# Patient Record
Sex: Female | Born: 1975 | Race: White | Hispanic: No | Smoking: Current some day smoker
Health system: Southern US, Community
[De-identification: ages and names within clinical notes are randomized; demographics above are authoritative.]

## PROBLEM LIST (undated history)

## (undated) DIAGNOSIS — N83209 Unspecified ovarian cyst, unspecified side: Secondary | ICD-10-CM

## (undated) DIAGNOSIS — C539 Malignant neoplasm of cervix uteri, unspecified: Secondary | ICD-10-CM

## (undated) DIAGNOSIS — J302 Other seasonal allergic rhinitis: Secondary | ICD-10-CM

## (undated) DIAGNOSIS — C801 Malignant (primary) neoplasm, unspecified: Secondary | ICD-10-CM

## (undated) HISTORY — PX: UPPER GASTROINTESTINAL ENDOSCOPY: SHX188

## (undated) HISTORY — PX: TUBAL LIGATION: SHX77

## (undated) HISTORY — PX: CHOLECYSTECTOMY: SHX55

## (undated) HISTORY — DX: Malignant neoplasm of cervix uteri, unspecified: C53.9

## (undated) HISTORY — PX: COLONOSCOPY: SHX174

---

## 1996-10-16 DIAGNOSIS — C539 Malignant neoplasm of cervix uteri, unspecified: Secondary | ICD-10-CM

## 1996-10-16 HISTORY — DX: Malignant neoplasm of cervix uteri, unspecified: C53.9

## 2008-04-15 ENCOUNTER — Emergency Department (HOSPITAL_BASED_OUTPATIENT_CLINIC_OR_DEPARTMENT_OTHER): Admission: EM | Admit: 2008-04-15 | Discharge: 2008-04-15 | Payer: Self-pay | Admitting: Emergency Medicine

## 2008-05-13 ENCOUNTER — Emergency Department (HOSPITAL_BASED_OUTPATIENT_CLINIC_OR_DEPARTMENT_OTHER): Admission: EM | Admit: 2008-05-13 | Discharge: 2008-05-13 | Payer: Self-pay | Admitting: Emergency Medicine

## 2008-05-28 ENCOUNTER — Emergency Department (HOSPITAL_BASED_OUTPATIENT_CLINIC_OR_DEPARTMENT_OTHER): Admission: EM | Admit: 2008-05-28 | Discharge: 2008-05-28 | Payer: Self-pay | Admitting: Emergency Medicine

## 2008-10-07 ENCOUNTER — Ambulatory Visit: Payer: Self-pay | Admitting: Diagnostic Radiology

## 2008-10-07 ENCOUNTER — Emergency Department (HOSPITAL_BASED_OUTPATIENT_CLINIC_OR_DEPARTMENT_OTHER): Admission: EM | Admit: 2008-10-07 | Discharge: 2008-10-07 | Payer: Self-pay | Admitting: Emergency Medicine

## 2010-01-07 ENCOUNTER — Emergency Department (HOSPITAL_BASED_OUTPATIENT_CLINIC_OR_DEPARTMENT_OTHER): Admission: EM | Admit: 2010-01-07 | Discharge: 2010-01-07 | Payer: Self-pay | Admitting: Emergency Medicine

## 2010-01-07 ENCOUNTER — Ambulatory Visit: Payer: Self-pay | Admitting: Diagnostic Radiology

## 2010-02-09 ENCOUNTER — Emergency Department (HOSPITAL_BASED_OUTPATIENT_CLINIC_OR_DEPARTMENT_OTHER): Admission: EM | Admit: 2010-02-09 | Discharge: 2010-02-09 | Payer: Self-pay | Admitting: Emergency Medicine

## 2010-02-22 ENCOUNTER — Emergency Department (HOSPITAL_BASED_OUTPATIENT_CLINIC_OR_DEPARTMENT_OTHER): Admission: EM | Admit: 2010-02-22 | Discharge: 2010-02-22 | Payer: Self-pay | Admitting: Emergency Medicine

## 2010-02-23 ENCOUNTER — Emergency Department (HOSPITAL_BASED_OUTPATIENT_CLINIC_OR_DEPARTMENT_OTHER): Admission: EM | Admit: 2010-02-23 | Discharge: 2010-02-23 | Payer: Self-pay | Admitting: Emergency Medicine

## 2011-04-03 ENCOUNTER — Emergency Department (HOSPITAL_BASED_OUTPATIENT_CLINIC_OR_DEPARTMENT_OTHER)
Admission: EM | Admit: 2011-04-03 | Discharge: 2011-04-03 | Disposition: A | Discharge status: Home / Self Care | Payer: Self-pay | Attending: Emergency Medicine | Admitting: Emergency Medicine

## 2011-04-03 ENCOUNTER — Emergency Department (INDEPENDENT_AMBULATORY_CARE_PROVIDER_SITE_OTHER): Payer: Self-pay

## 2011-04-03 DIAGNOSIS — Z8739 Personal history of other diseases of the musculoskeletal system and connective tissue: Secondary | ICD-10-CM | POA: Insufficient documentation

## 2011-04-03 DIAGNOSIS — M25579 Pain in unspecified ankle and joints of unspecified foot: Secondary | ICD-10-CM | POA: Insufficient documentation

## 2011-04-03 DIAGNOSIS — F319 Bipolar disorder, unspecified: Secondary | ICD-10-CM | POA: Insufficient documentation

## 2011-04-03 DIAGNOSIS — F17200 Nicotine dependence, unspecified, uncomplicated: Secondary | ICD-10-CM | POA: Insufficient documentation

## 2011-04-03 DIAGNOSIS — M79609 Pain in unspecified limb: Secondary | ICD-10-CM

## 2011-05-21 ENCOUNTER — Emergency Department (INDEPENDENT_AMBULATORY_CARE_PROVIDER_SITE_OTHER): Payer: Self-pay

## 2011-05-21 ENCOUNTER — Emergency Department (HOSPITAL_BASED_OUTPATIENT_CLINIC_OR_DEPARTMENT_OTHER)
Admission: EM | Admit: 2011-05-21 | Discharge: 2011-05-21 | Disposition: A | Discharge status: Home / Self Care | Payer: Self-pay | Attending: Emergency Medicine | Admitting: Emergency Medicine

## 2011-05-21 ENCOUNTER — Encounter: Payer: Self-pay | Admitting: Emergency Medicine

## 2011-05-21 DIAGNOSIS — W19XXXA Unspecified fall, initial encounter: Secondary | ICD-10-CM

## 2011-05-21 DIAGNOSIS — M25579 Pain in unspecified ankle and joints of unspecified foot: Secondary | ICD-10-CM | POA: Insufficient documentation

## 2011-05-21 DIAGNOSIS — M25569 Pain in unspecified knee: Secondary | ICD-10-CM

## 2011-05-21 MED ORDER — TRAMADOL HCL 50 MG PO TABS: 50.0000 mg | ORAL_TABLET | Freq: Four times a day (QID) | ORAL | Status: AC | PRN

## 2011-05-21 NOTE — ED Provider Notes (Signed)
History     CSN: [id number] Arrival date & time: 05/21/2011  6:20 PM  Chief Complaint  Patient presents with  . Knee Pain    Pt report injury to L knee and L ankle while walking with prior injury to area   HPI Comments: Pt states that he has previous injury to the area and then she twisted the area and fell today  Patient is a 35 y.o. female presenting with knee pain. The history is provided by the patient. No language interpreter was used.  Knee Pain This is a recurrent problem. The current episode started today. The problem occurs constantly. The problem has been unchanged. Pertinent negatives include no joint swelling. The symptoms are aggravated by walking and twisting. She has tried nothing for the symptoms. The treatment provided no relief.    No past medical history on file.  Past Surgical History  Procedure Date  . Cholecystectomy     Family History  Problem Relation Age of Onset  . Cancer Mother   . Asthma Father     History  Substance Use Topics  . Smoking status: Current Some Day Smoker -- 1.0 packs/day  . Smokeless tobacco: Not on file  . Alcohol Use: No    OB History    Grav Para Term Preterm Abortions TAB SAB Ect Mult Living                  Review of Systems  Musculoskeletal: Negative for joint swelling.  All other systems reviewed and are negative.    Physical Exam  BP 135/77  Pulse 99  Temp(Src) 97.9 F (36.6 C) (Oral)  Resp 22  SpO2 100%  LMP 05/21/2011  Physical Exam  Nursing note and vitals reviewed. Constitutional: She is oriented to person, place, and time. She appears well-developed and well-nourished.  HENT:  Head: Normocephalic.  Cardiovascular: Normal rate and regular rhythm.   Pulmonary/Chest: Effort normal and breath sounds normal.  Musculoskeletal: Normal range of motion.       No obvious swelling or deformity noted to the left ankle  Neurological: She is alert and oriented to person, place, and time.  Skin: Skin is warm  and dry.  Psychiatric: She has a normal mood and affect.    ED Course  Procedures  No results found for this or any previous visit. Dg Ankle Complete Left  05/21/2011  *RADIOLOGY REPORT*  Clinical Data: Fall, left ankle pain  LEFT ANKLE COMPLETE - 3+ VIEW  Comparison: 10/07/2008  Findings: No fracture or dislocation is seen. The ankle mortise is intact.  The base of the fifth metatarsal appears intact.  The joint spaces are preserved.  The visualized soft tissues are unremarkable.  IMPRESSION: No fracture or dislocation is seen.  Original Report Authenticated By: Charline Bills, M.D.   Dg Knee Complete 4 Views Left  05/21/2011  *RADIOLOGY REPORT*  Clinical Data: Fall, left knee pain  LEFT KNEE - COMPLETE 4+ VIEW  Comparison: 01/07/2010  Findings: No fracture or dislocation is seen.  The joint spaces are preserved.  The visualized soft tissues are unremarkable.  No suprapatellar knee joint effusion.  IMPRESSION: Normal knee radiographs.  Original Report Authenticated By: Charline Bills, M.D.    MDM No acute bony abnormality noted:pt refusing any splinting      Teressa Lower, NP 05/21/11 1942

## 2011-05-21 NOTE — ED Notes (Signed)
Pt report twisting L knee and L ankle while walking today guarding walkin with limp to site

## 2011-05-22 NOTE — ED Provider Notes (Signed)
Medical screening examination/treatment/procedure(s) were performed by non-physician practitioner and as supervising physician I was immediately available for consultation/collaboration.   Juliet Rude. Rubin Payor, MD 05/22/11 9562

## 2012-01-02 ENCOUNTER — Emergency Department (INDEPENDENT_AMBULATORY_CARE_PROVIDER_SITE_OTHER): Payer: Self-pay

## 2012-01-02 ENCOUNTER — Encounter (HOSPITAL_BASED_OUTPATIENT_CLINIC_OR_DEPARTMENT_OTHER): Payer: Self-pay | Admitting: Emergency Medicine

## 2012-01-02 ENCOUNTER — Emergency Department (HOSPITAL_BASED_OUTPATIENT_CLINIC_OR_DEPARTMENT_OTHER)
Admission: EM | Admit: 2012-01-02 | Discharge: 2012-01-02 | Disposition: A | Discharge status: Home / Self Care | Payer: Self-pay | Attending: Emergency Medicine | Admitting: Emergency Medicine

## 2012-01-02 DIAGNOSIS — J40 Bronchitis, not specified as acute or chronic: Secondary | ICD-10-CM | POA: Insufficient documentation

## 2012-01-02 DIAGNOSIS — F17200 Nicotine dependence, unspecified, uncomplicated: Secondary | ICD-10-CM | POA: Insufficient documentation

## 2012-01-02 DIAGNOSIS — Z79899 Other long term (current) drug therapy: Secondary | ICD-10-CM | POA: Insufficient documentation

## 2012-01-02 DIAGNOSIS — R05 Cough: Secondary | ICD-10-CM

## 2012-01-02 DIAGNOSIS — J45909 Unspecified asthma, uncomplicated: Secondary | ICD-10-CM | POA: Insufficient documentation

## 2012-01-02 HISTORY — DX: Other seasonal allergic rhinitis: J30.2

## 2012-01-02 MED ORDER — FLUCONAZOLE 150 MG PO TABS: 150.0000 mg | ORAL_TABLET | Freq: Every day | ORAL | Status: AC

## 2012-01-02 MED ORDER — ALBUTEROL SULFATE 2 MG PO TABS: 2.0000 mg | ORAL_TABLET | Freq: Three times a day (TID) | ORAL | Status: DC

## 2012-01-02 MED ORDER — AZITHROMYCIN 250 MG PO TABS: 250.0000 mg | ORAL_TABLET | Freq: Every day | ORAL | Status: AC

## 2012-01-02 MED ORDER — ALBUTEROL SULFATE HFA 108 (90 BASE) MCG/ACT IN AERS
2.0000 | INHALATION_SPRAY | Freq: Once | RESPIRATORY_TRACT | Status: AC
  Administered 2012-01-02: 2 via RESPIRATORY_TRACT
  Filled 2012-01-02: qty 6.7

## 2012-01-02 MED ORDER — PREDNISONE 20 MG PO TABS: 60.0000 mg | ORAL_TABLET | Freq: Every day | ORAL | Status: DC

## 2012-01-02 NOTE — Discharge Instructions (Signed)
Bronchitis Bronchitis is a problem of the air tubes leading to your lungs. This problem makes it hard for air to get in and out of the lungs. You may cough a lot because your air tubes are narrow. Going without care can cause lasting (chronic) bronchitis. HOME CARE   Drink enough fluids to keep your pee (urine) clear or pale yellow.   Use a cool mist humidifier.   Quit smoking if you smoke. If you keep smoking, the bronchitis might not get better.   Only take medicine as told by your doctor.  GET HELP RIGHT AWAY IF:   Coughing keeps you awake.   You start to wheeze.   You become more sick or weak.   You have a hard time breathing or get short of breath.   You cough up blood.   Coughing lasts more than 2 weeks.   You have a fever.   Your baby is older than 3 months with a rectal temperature of 102 F (38.9 C) or higher.   Your baby is 3 months old or younger with a rectal temperature of 100.4 F (38 C) or higher.  MAKE SURE YOU:  Understand these instructions.   Will watch your condition.   Will get help right away if you are not doing well or get worse.  Document Released: 03/20/2008 Document Revised: 09/21/2011 Document Reviewed: 09/03/2009 ExitCare Patient Information 2012 [employer] RESOURCE GUIDE  Dental Problems  Patients with Medicaid: [facility]                     [facility] [address]                                           [address] Phone:  [phone]                                                   Phone:  [phone]  If unable to pay or uninsured, contact:  [facility] or [facility] to become qualified for the adult dental clinic.  Chronic Pain Problems Contact [facility]  [phone] Patients need to be referred by their primary care doctor.  Insufficient Money for Medicine Contact [employer]:  call "211" or [facility] [phone].  No Primary Care  Doctor Call [facility]  [phone] Other agencies that provide inexpensive medical care    [facility]  [phone]    [facility]  [phone]    [facility]  [phone]    [facility]  [phone]    [facility]  [phone]    [facility]  [phone]  Psychological Services [facility]  [phone] [facility]  [phone] [facility]   [phone] (emergency services [phone])  Abuse/Neglect [place] Child Abuse Hotline (336) 641-3795 [place] [facility] Abuse Hotline 800-378-5315 (After Hours)  Emergency Shelter [facility] (336) 271-5985  [facility] (336) 275-9566 [facility] (704) 372-4663  MRSA Hotline #:   832-7006    [place] [facility]  [facility] [place]  [employer]                             [facility] [address] [place]                     [address]         [address]  [place]                                               [place]                              [place] Phone:  [phone]                                  Phone:  [phone]                   Phone:  [phone]  [facility] Phone:  [phone]  [place] Child Abuse Hotline (336) 342-1394 (336) 342-3537 (After Hours)  

## 2012-01-02 NOTE — ED Notes (Signed)
Pt c/o np cough, pain in ears, dry throat since November

## 2012-01-02 NOTE — ED Provider Notes (Signed)
History     CSN: [id number]  Arrival date & time 01/02/12  1540   First MD Initiated Contact with Patient 01/02/12 1618      Chief Complaint  Patient presents with  . Cough    (Consider location/radiation/quality/duration/timing/severity/associated sxs/prior treatment) HPI  pw h/o chronic bronchitis, asthma, chronic cough, worsening x days. +wheezing, worse at night. Productive cough with yellow phlegm, worse in the morning. Zyrtec, benadryl, allegra, mucinex with min relief. +Shortness of breath chronic November worsening.  C/O chest tightness under both breasts x one month. +Subj fever +chills. No rash.  No sick contacts. Recently lost her insurance, therefore ran out of albuterol inhaler  She is a smoker 3-5 cig/day   Past Medical History  Diagnosis Date  . Asthma   . Seasonal allergies   . Endometriosis     Past Surgical History  Procedure Date  . Cholecystectomy   . Tubal ligation   . Upper gastrointestinal endoscopy   . Colonoscopy     Family History  Problem Relation Age of Onset  . Cancer Mother   . Asthma Father     History  Substance Use Topics  . Smoking status: Current Some Day Smoker -- 1.0 packs/day  . Smokeless tobacco: Not on file  . Alcohol Use: No    OB History    Grav Para Term Preterm Abortions TAB SAB Ect Mult Living                  Review of Systems  All other systems reviewed and are negative.  except as noted HPI   Allergies  Chocolate; Onion; Iodine; Monistat; and Morphine and related  Home Medications   Current Outpatient Rx  Name Route Sig Dispense Refill  . ACETAMINOPHEN 500 MG PO TABS Oral Take 1,000 mg by mouth every 6 (six) hours as needed. For pain    . CETIRIZINE HCL 10 MG PO TABS Oral Take 10 mg by mouth daily.    Marland Kitchen DIPHENHYDRAMINE HCL 25 MG PO TABS Oral Take 50 mg by mouth 3 (three) times daily as needed. For runny nose    . FEXOFENADINE HCL 180 MG PO TABS Oral Take 180 mg by mouth daily.    . GUAIFENESIN ER  600 MG PO TB12 Oral Take 1,200 mg by mouth 3 (three) times daily.    . IBUPROFEN 800 MG PO TABS Oral Take 1,600 mg by mouth 2 (two) times daily as needed. For pain     . ALBUTEROL SULFATE 2 MG PO TABS Oral Take 1 tablet (2 mg total) by mouth 3 (three) times daily. 30 tablet 1  . AZITHROMYCIN 250 MG PO TABS Oral Take 1 tablet (250 mg total) by mouth daily. Take first 2 tablets together, then 1 every day until finished. 6 tablet 0  . PREDNISONE 20 MG PO TABS Oral Take 3 tablets (60 mg total) by mouth daily. 15 tablet 0    BP 128/80  Pulse 104  Temp(Src) 98.1 F (36.7 C) (Oral)  Resp 20  Ht 5\' 7"  (1.702 m)  Wt 216 lb (97.977 kg)  BMI 33.83 kg/m2  SpO2 100%  LMP 12/29/2011  Physical Exam  Nursing note and vitals reviewed. Constitutional: She is oriented to person, place, and time. She appears well-developed.  HENT:  Head: Atraumatic.       Cobblestoning posterior OP  Eyes: Conjunctivae and EOM are normal. Pupils are equal, round, and reactive to light.  Neck: Normal range of motion. Neck supple.  Cardiovascular:  Normal rate, regular rhythm, normal heart sounds and intact distal pulses.   Pulmonary/Chest: Effort normal. No respiratory distress. She has wheezes. She has no rales.       Min exp wheeze > Lt lung fields  Abdominal: Soft. She exhibits no distension. There is no tenderness. There is no rebound and no guarding.  Musculoskeletal: Normal range of motion.  Neurological: She is alert and oriented to person, place, and time.  Skin: Skin is warm and dry. No rash noted.  Psychiatric: She has a normal mood and affect.    ED Course  Procedures (including critical care time)  Labs Reviewed - No data to display Dg Chest 2 View  01/02/2012  *RADIOLOGY REPORT*  Clinical Data: History of cough since November 2012.  CHEST - 2 VIEW  Comparison: No priors.  Findings: Lungs appear mildly hyperexpanded.  No consolidative airspace disease.  No pleural effusions.  Pulmonary vasculature is  normal.  Cardiomediastinal silhouette is within normal limits.  IMPRESSION: 1.  Mild hyperexpansion without other acute findings.  This is nonspecific, and could be related to an exaggerated inspiratory effort during the examination, or could suggest underlying reactive airway disease.  Clinical correlation is recommended.  Original Report Authenticated By: Florencia Reasons, M.D.     1. Bronchitis     MDM  Acute on chronic bronchitis. Albuterol inh in ED. Home with azithromycin, prednisone, albuterol PO tabs. PMD referral.       Forbes Cellar, MD 01/02/12 [phone]

## 2012-05-12 ENCOUNTER — Encounter (HOSPITAL_BASED_OUTPATIENT_CLINIC_OR_DEPARTMENT_OTHER): Payer: Self-pay | Admitting: *Deleted

## 2012-05-12 ENCOUNTER — Emergency Department (HOSPITAL_BASED_OUTPATIENT_CLINIC_OR_DEPARTMENT_OTHER)
Admission: EM | Admit: 2012-05-12 | Discharge: 2012-05-12 | Disposition: A | Discharge status: Home / Self Care | Payer: Self-pay | Attending: Emergency Medicine | Admitting: Emergency Medicine

## 2012-05-12 DIAGNOSIS — X500XXA Overexertion from strenuous movement or load, initial encounter: Secondary | ICD-10-CM | POA: Insufficient documentation

## 2012-05-12 DIAGNOSIS — F17200 Nicotine dependence, unspecified, uncomplicated: Secondary | ICD-10-CM | POA: Insufficient documentation

## 2012-05-12 DIAGNOSIS — Y9389 Activity, other specified: Secondary | ICD-10-CM | POA: Insufficient documentation

## 2012-05-12 DIAGNOSIS — S8390XA Sprain of unspecified site of unspecified knee, initial encounter: Secondary | ICD-10-CM

## 2012-05-12 DIAGNOSIS — IMO0002 Reserved for concepts with insufficient information to code with codable children: Secondary | ICD-10-CM | POA: Insufficient documentation

## 2012-05-12 DIAGNOSIS — Y9301 Activity, walking, marching and hiking: Secondary | ICD-10-CM | POA: Insufficient documentation

## 2012-05-12 DIAGNOSIS — Y998 Other external cause status: Secondary | ICD-10-CM | POA: Insufficient documentation

## 2012-05-12 DIAGNOSIS — J45909 Unspecified asthma, uncomplicated: Secondary | ICD-10-CM | POA: Insufficient documentation

## 2012-05-12 DIAGNOSIS — T148XXA Other injury of unspecified body region, initial encounter: Secondary | ICD-10-CM

## 2012-05-12 DIAGNOSIS — M549 Dorsalgia, unspecified: Secondary | ICD-10-CM | POA: Insufficient documentation

## 2012-05-12 MED ORDER — DIAZEPAM 5 MG PO TABS: ORAL_TABLET | ORAL | Status: AC

## 2012-05-12 MED ORDER — IBUPROFEN 800 MG PO TABS: 800.0000 mg | ORAL_TABLET | Freq: Three times a day (TID) | ORAL | Status: AC

## 2012-05-12 MED ORDER — HYDROCODONE-ACETAMINOPHEN 5-500 MG PO TABS: 1.0000 | ORAL_TABLET | Freq: Four times a day (QID) | ORAL | Status: AC | PRN

## 2012-05-12 MED ORDER — IBUPROFEN 800 MG PO TABS
800.0000 mg | ORAL_TABLET | Freq: Once | ORAL | Status: DC
  Filled 2012-05-12: qty 1

## 2012-05-12 MED ORDER — OXYCODONE-ACETAMINOPHEN 5-325 MG PO TABS
2.0000 | ORAL_TABLET | Freq: Once | ORAL | Status: DC
  Filled 2012-05-12: qty 2

## 2012-05-12 NOTE — ED Notes (Signed)
Pt reports she was moving a fish tank down steps and started to fall- now c/o left knee and back pain

## 2012-05-12 NOTE — ED Provider Notes (Signed)
History     CSN: [id number]  Arrival date & time 05/12/12  1954   First MD Initiated Contact with Patient 05/12/12 2005      Chief Complaint  Patient presents with  . Back Pain  . Knee Pain    (Consider location/radiation/quality/duration/timing/severity/associated sxs/prior treatment) Patient is a 36 y.o. female presenting with back pain and knee pain. The history is provided by the patient.  Back Pain   Knee Pain   patient presents to emergency department complaining of left knee injury and back injury about two hours prior to arrival. Patient states that she was carrying a large fish take down the steps and began to fall and twisted her knee and back to keep herself from falling. Patient states she has history of ligament injury of her left knee with intermittent pain over many years. Patient states that pain is aggravated by movement and improved with lying still. Patient took nothing for pain prior to arrival. She denies any extremity numbness/tingling/weakness. She denies additional injury. Patient states she did not actually fall to the ground but caught herself. She denies swelling of joints or break in skin.  Past Medical History  Diagnosis Date  . Asthma   . Seasonal allergies   . Endometriosis     Past Surgical History  Procedure Date  . Cholecystectomy   . Tubal ligation   . Upper gastrointestinal endoscopy   . Colonoscopy     Family History  Problem Relation Age of Onset  . Cancer Mother   . Asthma Father     History  Substance Use Topics  . Smoking status: Current Some Day Smoker -- 1.0 packs/day  . Smokeless tobacco: Never Used  . Alcohol Use: No    OB History    Grav Para Term Preterm Abortions TAB SAB Ect Mult Living                  Review of Systems  Musculoskeletal: Positive for back pain.  All other systems reviewed and are negative.    Allergies  Chocolate; Onion; Iodine; Miconazole nitrate; and Morphine and related  Home  Medications   Current Outpatient Rx  Name Route Sig Dispense Refill  . ALBUTEROL SULFATE HFA 108 (90 BASE) MCG/ACT IN AERS Inhalation Inhale 2 puffs into the lungs every 6 (six) hours as needed. For wheezing or shortness of breath    . DIPHENHYDRAMINE HCL 25 MG PO TABS Oral Take 50 mg by mouth 2 (two) times daily as needed. For allergies    . FEXOFENADINE HCL 180 MG PO TABS Oral Take 180 mg by mouth daily.    Marland Kitchen FLUTICASONE-SALMETEROL 250-50 MCG/DOSE IN AEPB Inhalation Inhale 1 puff into the lungs 2 (two) times daily as needed. For severe asthma attacks    . DIAZEPAM 5 MG PO TABS  Take 1 tablet by mouth every 4-6 hours as needed for muscle relaxation 15 tablet 0  . HYDROCODONE-ACETAMINOPHEN 5-500 MG PO TABS Oral Take 1-2 tablets by mouth every 6 (six) hours as needed for pain. 15 tablet 0  . IBUPROFEN 800 MG PO TABS Oral Take 1 tablet (800 mg total) by mouth 3 (three) times daily. Take 800mg  by mouth at breakfast, lunch and dinner for the next 5 days 21 tablet 0    BP 127/82  Pulse 93  Temp 97.9 F (36.6 C) (Oral)  Resp 20  Ht 5\' 7"  (1.702 m)  Wt 225 lb (102.059 kg)  BMI 35.24 kg/m2  SpO2 99%  LMP  05/05/2012  Physical Exam  Constitutional: She is oriented to person, place, and time. She appears well-developed and well-nourished. No distress.  HENT:  Head: Normocephalic and atraumatic.  Eyes: Conjunctivae are normal.  Neck: Normal range of motion. Neck supple.  Cardiovascular: Normal rate, regular rhythm and intact distal pulses.   Pulmonary/Chest: Effort normal.  Musculoskeletal: Normal range of motion. She exhibits tenderness. She exhibits no edema.       Right ankle: She exhibits swelling. tenderness.       Tenderness to palpation of soft tissue of lower lumbar back but no midline spinal tenderness to palpation. Tenderness to palpation of entire left knee with pain with full range of motion but no crepitus. Negative ballottement. No laxity with anterior/posterior/medial/lateral  stress. No swelling of lower extremity. 5 out of 5 strength of bilateral lower extremity is. Normal reflexes.  Neurological: She is alert and oriented to person, place, and time. She has normal reflexes.       Normal sensation of entire foot.   Skin: Skin is warm and dry. No rash noted. She is not diaphoretic. No erythema. No pallor.  Psychiatric: She has a normal mood and affect. Her behavior is normal.    ED Course  Procedures (including critical care time)  PO ibuprofen and percocet.   Labs Reviewed - No data to display No results found.   1. Back pain   2. Muscle strain   3. Knee sprain       MDM  Strain of lower back and sprain of left knee without any red flags for back pain. No signs or symptoms of central cord compression or cauda equina. Patient did not actually fall to the ground but twisted her back and knee and caught herself from falling. Question soft tissue injury of knee. She has no effusion and knee or laxity with stress and LLE is neurvo vasc intact. She is ambulating with mild pain in knee and back but without difficulty. Spoke at length with patient conservative management of muscle strain and knee sprain but gave her primary care referral for further evaluation of ongoing pain. Patient voices her understanding is agreeable plan. Patient is not due to return to work for one week and therefore can rest at home.        [place], [place] 05/12/12 2039

## 2012-05-14 NOTE — ED Provider Notes (Signed)
Medical screening examination/treatment/procedure(s) were performed by non-physician practitioner and as supervising physician I was immediately available for consultation/collaboration.  Roseland Braun, MD 05/14/12 1038 

## 2012-10-16 ENCOUNTER — Emergency Department (HOSPITAL_BASED_OUTPATIENT_CLINIC_OR_DEPARTMENT_OTHER)
Admission: EM | Admit: 2012-10-16 | Discharge: 2012-10-16 | Disposition: A | Discharge status: Home / Self Care | Payer: Self-pay | Attending: Emergency Medicine | Admitting: Emergency Medicine

## 2012-10-16 ENCOUNTER — Encounter (HOSPITAL_BASED_OUTPATIENT_CLINIC_OR_DEPARTMENT_OTHER): Payer: Self-pay | Admitting: *Deleted

## 2012-10-16 DIAGNOSIS — Y939 Activity, unspecified: Secondary | ICD-10-CM | POA: Insufficient documentation

## 2012-10-16 DIAGNOSIS — J309 Allergic rhinitis, unspecified: Secondary | ICD-10-CM | POA: Insufficient documentation

## 2012-10-16 DIAGNOSIS — Z79899 Other long term (current) drug therapy: Secondary | ICD-10-CM | POA: Insufficient documentation

## 2012-10-16 DIAGNOSIS — L0291 Cutaneous abscess, unspecified: Secondary | ICD-10-CM

## 2012-10-16 DIAGNOSIS — Z9089 Acquired absence of other organs: Secondary | ICD-10-CM | POA: Insufficient documentation

## 2012-10-16 DIAGNOSIS — X500XXA Overexertion from strenuous movement or load, initial encounter: Secondary | ICD-10-CM | POA: Insufficient documentation

## 2012-10-16 DIAGNOSIS — F17200 Nicotine dependence, unspecified, uncomplicated: Secondary | ICD-10-CM | POA: Insufficient documentation

## 2012-10-16 DIAGNOSIS — Z9851 Tubal ligation status: Secondary | ICD-10-CM | POA: Insufficient documentation

## 2012-10-16 DIAGNOSIS — S99919A Unspecified injury of unspecified ankle, initial encounter: Secondary | ICD-10-CM | POA: Insufficient documentation

## 2012-10-16 DIAGNOSIS — S86912A Strain of unspecified muscle(s) and tendon(s) at lower leg level, left leg, initial encounter: Secondary | ICD-10-CM

## 2012-10-16 DIAGNOSIS — S8990XA Unspecified injury of unspecified lower leg, initial encounter: Secondary | ICD-10-CM | POA: Insufficient documentation

## 2012-10-16 DIAGNOSIS — Z8742 Personal history of other diseases of the female genital tract: Secondary | ICD-10-CM | POA: Insufficient documentation

## 2012-10-16 DIAGNOSIS — Y92009 Unspecified place in unspecified non-institutional (private) residence as the place of occurrence of the external cause: Secondary | ICD-10-CM | POA: Insufficient documentation

## 2012-10-16 DIAGNOSIS — L02219 Cutaneous abscess of trunk, unspecified: Secondary | ICD-10-CM | POA: Insufficient documentation

## 2012-10-16 DIAGNOSIS — J45909 Unspecified asthma, uncomplicated: Secondary | ICD-10-CM | POA: Insufficient documentation

## 2012-10-16 LAB — BASIC METABOLIC PANEL
BUN: 5 mg/dL — ABNORMAL LOW (ref 6–23)
CO2: 28 mEq/L (ref 19–32)
Calcium: 8.8 mg/dL (ref 8.4–10.5)
Chloride: 101 mEq/L (ref 96–112)
Creatinine, Ser: 0.7 mg/dL (ref 0.50–1.10)
Glucose, Bld: 90 mg/dL (ref 70–99)

## 2012-10-16 LAB — CBC WITH DIFFERENTIAL/PLATELET
Eosinophils Relative: 1 % (ref 0–5)
HCT: 38.2 % (ref 36.0–46.0)
Hemoglobin: 13 g/dL (ref 12.0–15.0)
Lymphocytes Relative: 10 % — ABNORMAL LOW (ref 12–46)
Lymphs Abs: 1.9 10*3/uL (ref 0.7–4.0)
MCV: 93.6 fL (ref 78.0–100.0)
Monocytes Absolute: 1.9 10*3/uL — ABNORMAL HIGH (ref 0.1–1.0)
Monocytes Relative: 10 % (ref 3–12)
Neutro Abs: 15.2 10*3/uL — ABNORMAL HIGH (ref 1.7–7.7)
RDW: 13.1 % (ref 11.5–15.5)
WBC: 19.2 10*3/uL — ABNORMAL HIGH (ref 4.0–10.5)

## 2012-10-16 LAB — URINALYSIS, ROUTINE W REFLEX MICROSCOPIC
Bilirubin Urine: NEGATIVE
Glucose, UA: NEGATIVE mg/dL
Ketones, ur: NEGATIVE mg/dL
Nitrite: POSITIVE — AB
Protein, ur: NEGATIVE mg/dL
Specific Gravity, Urine: 1.009 (ref 1.005–1.030)
Urobilinogen, UA: 0.2 mg/dL (ref 0.0–1.0)
pH: 6 (ref 5.0–8.0)

## 2012-10-16 LAB — PREGNANCY, URINE: Preg Test, Ur: NEGATIVE

## 2012-10-16 LAB — URINE MICROSCOPIC-ADD ON

## 2012-10-16 MED ORDER — VANCOMYCIN HCL IN DEXTROSE 1-5 GM/200ML-% IV SOLN
1000.0000 mg | Freq: Once | INTRAVENOUS | Status: AC
  Administered 2012-10-16: 1000 mg via INTRAVENOUS
  Filled 2012-10-16: qty 200

## 2012-10-16 MED ORDER — HYDROCODONE-ACETAMINOPHEN 5-325 MG PO TABS: 2.0000 | ORAL_TABLET | ORAL | Status: DC | PRN

## 2012-10-16 MED ORDER — SODIUM CHLORIDE 0.9 % IV SOLN
Freq: Once | INTRAVENOUS | Status: AC
  Administered 2012-10-16: 17:00:00 via INTRAVENOUS

## 2012-10-16 MED ORDER — SULFAMETHOXAZOLE-TRIMETHOPRIM 800-160 MG PO TABS: 1.0000 | ORAL_TABLET | Freq: Two times a day (BID) | ORAL | Status: DC

## 2012-10-16 MED ORDER — DEXTROSE 5 % IV SOLN
1.0000 g | INTRAVENOUS | Status: DC
  Administered 2012-10-16: 1 g via INTRAVENOUS
  Filled 2012-10-16: qty 10

## 2012-10-16 MED ORDER — FLUCONAZOLE 150 MG PO TABS: 150.0000 mg | ORAL_TABLET | Freq: Once | ORAL | Status: AC

## 2012-10-16 MED ORDER — FLUCONAZOLE 50 MG PO TABS
150.0000 mg | ORAL_TABLET | Freq: Once | ORAL | Status: AC
  Administered 2012-10-16: 150 mg via ORAL
  Filled 2012-10-16: qty 1

## 2012-10-16 NOTE — ED Notes (Signed)
Pt has multiple complaints, the main one being a ?boil in her groin area. "In protecting that, I have ? Twisted my left knee."

## 2012-10-16 NOTE — ED Provider Notes (Signed)
Medical screening examination/treatment/procedure(s) were performed by non-physician practitioner and as supervising physician I was immediately available for consultation/collaboration.   Gavin Pound. Oletta Lamas, MD 10/16/12 5621

## 2012-10-16 NOTE — ED Provider Notes (Signed)
History     CSN: [id number]  Arrival date & time 10/16/12  1416   First MD Initiated Contact with Patient 10/16/12 1631      Chief Complaint  Patient presents with  . Recurrent Skin Infections    (Consider location/radiation/quality/duration/timing/severity/associated sxs/prior treatment) Patient is a 37 y.o. female presenting with abscess. The history is provided by the patient. No language interpreter was used.  Abscess  This is a new problem. The onset was gradual. The problem occurs continuously. The problem has been gradually worsening. The abscess is present on the left lower leg. The abscess is characterized by redness and swelling. The abscess first occurred at home.   Pt reports she has had 3 abscesses lower abdomen.  Pt reports she was able to bust 2 of them and drain.   Pt complains of feeling sick now and one abscess is increasing in size.   Pt also has knee problems and injured her knee trying not to hit her abdomen Past Medical History  Diagnosis Date  . Asthma   . Seasonal allergies   . Endometriosis     Past Surgical History  Procedure Date  . Cholecystectomy   . Tubal ligation   . Upper gastrointestinal endoscopy   . Colonoscopy     Family History  Problem Relation Age of Onset  . Cancer Mother   . Asthma Father     History  Substance Use Topics  . Smoking status: Current Some Day Smoker -- 1.0 packs/day  . Smokeless tobacco: Never Used  . Alcohol Use: No    OB History    Grav Para Term Preterm Abortions TAB SAB Ect Mult Living                  Review of Systems  Skin: Positive for wound.  All other systems reviewed and are negative.    Allergies  Chocolate; Onion; Iodine; Miconazole nitrate; and Morphine and related  Home Medications   Current Outpatient Rx  Name  Route  Sig  Dispense  Refill  . ALBUTEROL SULFATE HFA 108 (90 BASE) MCG/ACT IN AERS   Inhalation   Inhale 2 puffs into the lungs every 6 (six) hours as needed. For  wheezing or shortness of breath         . DIPHENHYDRAMINE HCL 25 MG PO TABS   Oral   Take 50 mg by mouth 2 (two) times daily as needed. For allergies         . FEXOFENADINE HCL 180 MG PO TABS   Oral   Take 180 mg by mouth daily.         Marland Kitchen FLUTICASONE-SALMETEROL 250-50 MCG/DOSE IN AEPB   Inhalation   Inhale 1 puff into the lungs 2 (two) times daily as needed. For severe asthma attacks           BP 131/74  Pulse 93  Temp 98.2 F (36.8 C) (Oral)  Resp 18  Ht 5\' 7"  (1.702 m)  Wt 200 lb (90.719 kg)  BMI 31.32 kg/m2  SpO2 99%  LMP 10/02/2012  Physical Exam  Nursing note and vitals reviewed. Constitutional: She appears well-developed and well-nourished.  HENT:  Head: Normocephalic.  Eyes: Pupils are equal, round, and reactive to light.  Neck: Normal range of motion.  Cardiovascular: Normal rate and normal heart sounds.   Pulmonary/Chest: Effort normal and breath sounds normal.  Abdominal: Soft. Bowel sounds are normal.  Musculoskeletal: She exhibits tenderness.       Tender left  knee  Skin: Skin is warm.       Swollen red area left pubic area, fluctuant area palpable  Psychiatric: She has a normal mood and affect.    ED Course  INCISION AND DRAINAGE Date/Time: 10/16/2012 7:20 PM Performed by: Elson Areas Authorized by: Elson Areas Consent: Verbal consent not obtained. Risks and benefits: risks, benefits and alternatives were discussed Consent given by: patient Patient identity confirmed: verbally with patient Time out: Immediately prior to procedure a "time out" was called to verify the correct patient, procedure, equipment, support staff and site/side marked as required. Type: abscess Location: suprapubic. Anesthesia: local infiltration Patient sedated: no Scalpel size: 11 Complexity: simple Drainage: purulent Drainage amount: moderate Wound treatment: wound left open Packing material: 1/4 in iodoform gauze Patient tolerance: Patient tolerated  the procedure well with no immediate complications.   (including critical care time)  Labs Reviewed  URINALYSIS, ROUTINE W REFLEX MICROSCOPIC - Abnormal; Notable for the following:    Hgb urine dipstick LARGE (*)     Nitrite POSITIVE (*)     Leukocytes, UA TRACE (*)     All other components within normal limits  URINE MICROSCOPIC-ADD ON - Abnormal; Notable for the following:    Bacteria, UA MANY (*)     All other components within normal limits  CBC WITH DIFFERENTIAL - Abnormal; Notable for the following:    WBC 19.2 (*)     Neutrophils Relative 79 (*)     Neutro Abs 15.2 (*)     Lymphocytes Relative 10 (*)     Monocytes Absolute 1.9 (*)     All other components within normal limits  BASIC METABOLIC PANEL - Abnormal; Notable for the following:    Potassium 3.0 (*)     BUN 5 (*)     All other components within normal limits  PREGNANCY, URINE  URINE CULTURE   No results found.   No diagnosis found.    MDM  Pt given vancomycin and rocephin   Pt given rx for bactrim and hydrocodone for pain        Lonia Skinner [place], [place] 10/16/12 Ernestina [place]

## 2012-10-17 ENCOUNTER — Encounter (HOSPITAL_BASED_OUTPATIENT_CLINIC_OR_DEPARTMENT_OTHER): Payer: Self-pay | Admitting: *Deleted

## 2012-10-17 ENCOUNTER — Emergency Department (HOSPITAL_BASED_OUTPATIENT_CLINIC_OR_DEPARTMENT_OTHER)
Admission: EM | Admit: 2012-10-17 | Discharge: 2012-10-17 | Disposition: A | Discharge status: Home / Self Care | Payer: Self-pay | Attending: Emergency Medicine | Admitting: Emergency Medicine

## 2012-10-17 DIAGNOSIS — J309 Allergic rhinitis, unspecified: Secondary | ICD-10-CM | POA: Insufficient documentation

## 2012-10-17 DIAGNOSIS — L0291 Cutaneous abscess, unspecified: Secondary | ICD-10-CM

## 2012-10-17 DIAGNOSIS — L02219 Cutaneous abscess of trunk, unspecified: Secondary | ICD-10-CM | POA: Insufficient documentation

## 2012-10-17 DIAGNOSIS — F17200 Nicotine dependence, unspecified, uncomplicated: Secondary | ICD-10-CM | POA: Insufficient documentation

## 2012-10-17 DIAGNOSIS — L03319 Cellulitis of trunk, unspecified: Secondary | ICD-10-CM | POA: Insufficient documentation

## 2012-10-17 DIAGNOSIS — Z9851 Tubal ligation status: Secondary | ICD-10-CM | POA: Insufficient documentation

## 2012-10-17 DIAGNOSIS — J45909 Unspecified asthma, uncomplicated: Secondary | ICD-10-CM | POA: Insufficient documentation

## 2012-10-17 DIAGNOSIS — Z8742 Personal history of other diseases of the female genital tract: Secondary | ICD-10-CM | POA: Insufficient documentation

## 2012-10-17 DIAGNOSIS — Z9089 Acquired absence of other organs: Secondary | ICD-10-CM | POA: Insufficient documentation

## 2012-10-17 DIAGNOSIS — Z79899 Other long term (current) drug therapy: Secondary | ICD-10-CM | POA: Insufficient documentation

## 2012-10-17 MED ORDER — DEXTROSE 5 % IV SOLN
1.0000 g | INTRAVENOUS | Status: DC
  Administered 2012-10-17: 1 g via INTRAVENOUS
  Filled 2012-10-17: qty 10

## 2012-10-17 MED ORDER — OXYCODONE-ACETAMINOPHEN 5-325 MG PO TABS: 1.0000 | ORAL_TABLET | Freq: Four times a day (QID) | ORAL | Status: DC | PRN

## 2012-10-17 MED ORDER — VANCOMYCIN HCL IN DEXTROSE 1-5 GM/200ML-% IV SOLN
1000.0000 mg | Freq: Once | INTRAVENOUS | Status: AC
  Administered 2012-10-17: 1000 mg via INTRAVENOUS
  Filled 2012-10-17: qty 200

## 2012-10-17 NOTE — ED Provider Notes (Signed)
Medical screening examination/treatment/procedure(s) were performed by non-physician practitioner and as supervising physician I was immediately available for consultation/collaboration.  Holmes H[place], MD 10/17/12 2348 

## 2012-10-17 NOTE — ED Notes (Signed)
Here for recheck I&D abscess to her lower abdomen.

## 2012-10-17 NOTE — ED Provider Notes (Signed)
History     CSN: [id number]  Arrival date & time 10/17/12  2006   First MD Initiated Contact with Patient 10/17/12 2143      Chief Complaint  Patient presents with  . Follow-up    (Consider location/radiation/quality/duration/timing/severity/associated sxs/prior treatment) Patient is a 37 y.o. female presenting with abscess. The history is provided by the patient. No language interpreter was used.  Abscess  This is a new problem. The current episode started less than one week ago. The onset was gradual. The problem occurs continuously. The problem has been gradually worsening. The abscess is characterized by redness.  Pt seen here by me yesterday and I did I and D.  Pt here for recheck and second dosage of antibiotics.  Pt reports no change.  No fever, no chills  Past Medical History  Diagnosis Date  . Asthma   . Seasonal allergies   . Endometriosis     Past Surgical History  Procedure Date  . Cholecystectomy   . Tubal ligation   . Upper gastrointestinal endoscopy   . Colonoscopy     Family History  Problem Relation Age of Onset  . Cancer Mother   . Asthma Father     History  Substance Use Topics  . Smoking status: Current Some Day Smoker -- 1.0 packs/day  . Smokeless tobacco: Never Used  . Alcohol Use: No    OB History    Grav Para Term Preterm Abortions TAB SAB Ect Mult Living                  Review of Systems  Skin: Positive for wound.  All other systems reviewed and are negative.    Allergies  Chocolate; Onion; Iodine; Miconazole nitrate; and Morphine and related  Home Medications   Current Outpatient Rx  Name  Route  Sig  Dispense  Refill  . ALBUTEROL SULFATE HFA 108 (90 BASE) MCG/ACT IN AERS   Inhalation   Inhale 2 puffs into the lungs every 6 (six) hours as needed. For wheezing or shortness of breath         . DIPHENHYDRAMINE HCL 25 MG PO TABS   Oral   Take 50 mg by mouth 2 (two) times daily as needed. For allergies         .  FEXOFENADINE HCL 180 MG PO TABS   Oral   Take 180 mg by mouth daily.         Marland Kitchen FLUCONAZOLE 150 MG PO TABS   Oral   Take 1 tablet (150 mg total) by mouth once.   1 tablet   0   . FLUTICASONE-SALMETEROL 250-50 MCG/DOSE IN AEPB   Inhalation   Inhale 1 puff into the lungs 2 (two) times daily as needed. For severe asthma attacks         . HYDROCODONE-ACETAMINOPHEN 5-325 MG PO TABS   Oral   Take 2 tablets by mouth every 4 (four) hours as needed for pain.   16 tablet   0   . SULFAMETHOXAZOLE-TRIMETHOPRIM 800-160 MG PO TABS   Oral   Take 1 tablet by mouth every 12 (twelve) hours.   20 tablet   0     BP 121/65  Pulse 88  Temp 97.8 F (36.6 C) (Oral)  Resp 20  SpO2 99%  LMP 10/02/2012  Physical Exam  Nursing note and vitals reviewed. Constitutional: She appears well-developed and well-nourished.  HENT:  Head: Normocephalic.  Neck: Normal range of motion. Thyromegaly present.  Cardiovascular:  Normal rate.   Pulmonary/Chest: Effort normal.  Musculoskeletal: Normal range of motion.  Neurological: She is alert.  Skin: Skin is warm.       Swollen area suprapubic, erythematous, no new areas of fluctuance.      ED Course  Procedures (including critical care time)  Labs Reviewed - No data to display No results found.   No diagnosis found.    MDM  Pt given rocephin and vancomycin iv.  Pt advised to continue medication.  I will have pt pull packing out tommorow and recheck here in 2 days       Lonia Skinner [place], [place] 10/17/12 2219

## 2012-10-18 LAB — URINE CULTURE: Colony Count: >=100000

## 2012-10-19 NOTE — ED Notes (Signed)
+  Urine. Patient treated with Septra DS. Sensitive to same. Per protocol MD. °

## 2013-02-05 ENCOUNTER — Emergency Department (HOSPITAL_BASED_OUTPATIENT_CLINIC_OR_DEPARTMENT_OTHER)
Admission: EM | Admit: 2013-02-05 | Discharge: 2013-02-05 | Disposition: A | Discharge status: Home / Self Care | Payer: Self-pay | Attending: Emergency Medicine | Admitting: Emergency Medicine

## 2013-02-05 ENCOUNTER — Emergency Department (HOSPITAL_BASED_OUTPATIENT_CLINIC_OR_DEPARTMENT_OTHER): Payer: Self-pay

## 2013-02-05 ENCOUNTER — Encounter (HOSPITAL_BASED_OUTPATIENT_CLINIC_OR_DEPARTMENT_OTHER): Payer: Self-pay | Admitting: *Deleted

## 2013-02-05 DIAGNOSIS — Z8742 Personal history of other diseases of the female genital tract: Secondary | ICD-10-CM | POA: Insufficient documentation

## 2013-02-05 DIAGNOSIS — R062 Wheezing: Secondary | ICD-10-CM

## 2013-02-05 DIAGNOSIS — R51 Headache: Secondary | ICD-10-CM | POA: Insufficient documentation

## 2013-02-05 DIAGNOSIS — IMO0002 Reserved for concepts with insufficient information to code with codable children: Secondary | ICD-10-CM | POA: Insufficient documentation

## 2013-02-05 DIAGNOSIS — Z79899 Other long term (current) drug therapy: Secondary | ICD-10-CM | POA: Insufficient documentation

## 2013-02-05 DIAGNOSIS — R05 Cough: Secondary | ICD-10-CM | POA: Insufficient documentation

## 2013-02-05 DIAGNOSIS — J3489 Other specified disorders of nose and nasal sinuses: Secondary | ICD-10-CM | POA: Insufficient documentation

## 2013-02-05 DIAGNOSIS — N898 Other specified noninflammatory disorders of vagina: Secondary | ICD-10-CM | POA: Insufficient documentation

## 2013-02-05 DIAGNOSIS — F17200 Nicotine dependence, unspecified, uncomplicated: Secondary | ICD-10-CM | POA: Insufficient documentation

## 2013-02-05 DIAGNOSIS — R059 Cough, unspecified: Secondary | ICD-10-CM | POA: Insufficient documentation

## 2013-02-05 DIAGNOSIS — Z8709 Personal history of other diseases of the respiratory system: Secondary | ICD-10-CM | POA: Insufficient documentation

## 2013-02-05 DIAGNOSIS — Z3202 Encounter for pregnancy test, result negative: Secondary | ICD-10-CM | POA: Insufficient documentation

## 2013-02-05 DIAGNOSIS — J45909 Unspecified asthma, uncomplicated: Secondary | ICD-10-CM | POA: Insufficient documentation

## 2013-02-05 DIAGNOSIS — R0602 Shortness of breath: Secondary | ICD-10-CM | POA: Insufficient documentation

## 2013-02-05 DIAGNOSIS — N390 Urinary tract infection, site not specified: Secondary | ICD-10-CM | POA: Insufficient documentation

## 2013-02-05 LAB — CBC WITH DIFFERENTIAL/PLATELET
HCT: 40 % (ref 36.0–46.0)
Hemoglobin: 13.8 g/dL (ref 12.0–15.0)
Lymphs Abs: 2.1 10*3/uL (ref 0.7–4.0)
MCH: 32.1 pg (ref 26.0–34.0)
Monocytes Absolute: 1.1 10*3/uL — ABNORMAL HIGH (ref 0.1–1.0)
Monocytes Relative: 9 % (ref 3–12)
Neutro Abs: 9.7 10*3/uL — ABNORMAL HIGH (ref 1.7–7.7)
Neutrophils Relative %: 75 % (ref 43–77)
RBC: 4.3 MIL/uL (ref 3.87–5.11)

## 2013-02-05 LAB — URINALYSIS, ROUTINE W REFLEX MICROSCOPIC
Glucose, UA: NEGATIVE mg/dL
Ketones, ur: 15 mg/dL — AB
Protein, ur: 30 mg/dL — AB
pH: 5.5 (ref 5.0–8.0)

## 2013-02-05 LAB — URINE MICROSCOPIC-ADD ON

## 2013-02-05 LAB — BASIC METABOLIC PANEL
BUN: 7 mg/dL (ref 6–23)
Chloride: 106 mEq/L (ref 96–112)
Creatinine, Ser: 0.7 mg/dL (ref 0.50–1.10)
Glucose, Bld: 106 mg/dL — ABNORMAL HIGH (ref 70–99)
Potassium: 3.4 mEq/L — ABNORMAL LOW (ref 3.5–5.1)

## 2013-02-05 MED ORDER — KETOROLAC TROMETHAMINE 30 MG/ML IJ SOLN
30.0000 mg | Freq: Once | INTRAMUSCULAR | Status: AC
  Administered 2013-02-05: 30 mg via INTRAVENOUS
  Filled 2013-02-05: qty 1

## 2013-02-05 MED ORDER — DIPHENHYDRAMINE HCL 50 MG/ML IJ SOLN
25.0000 mg | Freq: Once | INTRAMUSCULAR | Status: AC
  Administered 2013-02-05: 25 mg via INTRAVENOUS
  Filled 2013-02-05: qty 1

## 2013-02-05 MED ORDER — SODIUM CHLORIDE 0.9 % IV BOLUS (SEPSIS)
1000.0000 mL | Freq: Once | INTRAVENOUS | Status: AC
  Administered 2013-02-05: 1000 mL via INTRAVENOUS

## 2013-02-05 MED ORDER — FLUCONAZOLE 150 MG PO TABS: 150.0000 mg | ORAL_TABLET | Freq: Every day | ORAL | Status: AC

## 2013-02-05 MED ORDER — CEPHALEXIN 500 MG PO CAPS: ORAL_CAPSULE | ORAL | Status: DC

## 2013-02-05 MED ORDER — IPRATROPIUM BROMIDE 0.02 % IN SOLN
0.5000 mg | Freq: Once | RESPIRATORY_TRACT | Status: AC
  Administered 2013-02-05: 0.5 mg via RESPIRATORY_TRACT
  Filled 2013-02-05: qty 2.5

## 2013-02-05 MED ORDER — ALBUTEROL SULFATE (5 MG/ML) 0.5% IN NEBU
5.0000 mg | INHALATION_SOLUTION | Freq: Once | RESPIRATORY_TRACT | Status: AC
  Administered 2013-02-05: 5 mg via RESPIRATORY_TRACT
  Filled 2013-02-05: qty 1

## 2013-02-05 MED ORDER — SODIUM CHLORIDE 0.9 % IV BOLUS (SEPSIS)
500.0000 mL | Freq: Once | INTRAVENOUS | Status: AC
  Administered 2013-02-05: 500 mL via INTRAVENOUS

## 2013-02-05 MED ORDER — CEFTRIAXONE SODIUM 1 G IJ SOLR
1.0000 g | Freq: Once | INTRAMUSCULAR | Status: AC
  Administered 2013-02-05: 1 g via INTRAVENOUS
  Filled 2013-02-05: qty 10

## 2013-02-05 MED ORDER — ALBUTEROL SULFATE HFA 108 (90 BASE) MCG/ACT IN AERS
1.0000 | INHALATION_SPRAY | Freq: Once | RESPIRATORY_TRACT | Status: AC
  Administered 2013-02-05: 1 via RESPIRATORY_TRACT
  Filled 2013-02-05: qty 6.7

## 2013-02-05 MED ORDER — METOCLOPRAMIDE HCL 5 MG/ML IJ SOLN
10.0000 mg | Freq: Once | INTRAMUSCULAR | Status: AC
  Administered 2013-02-05: 10 mg via INTRAVENOUS
  Filled 2013-02-05: qty 2

## 2013-02-05 NOTE — ED Notes (Signed)
Pt up to BR with steady gait-CCUA obtained

## 2013-02-05 NOTE — ED Provider Notes (Signed)
History     CSN: [id number]  Arrival date & time 02/05/13  1535   First MD Initiated Contact with Patient 02/05/13 1805      Chief Complaint  Patient presents with  . Abdominal Pain  . Vaginal Bleeding    (Consider location/radiation/quality/duration/timing/severity/associated sxs/prior treatment) HPI  37 year old female presents emergency Department with chief complaint of abdominal pain.  Patient states that she is currently menstruating.  She denies any other vaginal symptoms such as discharge, follow her, dyspareunia.  The patient does have a past history of endometriosis.  She states that she has her period every 2 weeks and they're very heavy she has significant pain with menstruation.  The patient is currently uninsured and has not have any primary care or her OB/GYN followup.  The patient is audibly wheezing and appears to be slightly short of breath.  Patient states that she has been diagnosed with chronic bronchitis.  She has severe seasonal allergies and has been coughing up green phlegm.  The patient has had subjective fever and chills.  She has itchy runny watery eyes and rhinorrhea. The patient denies any nausea, vomiting, diarrhea.  Patient denies any urinary symptoms such as hematuria, dysuria, frequency or urgency.  The patient does feel short of breath but denies any chest pain.  She has a headache which she attributes to coughing so much.  She states the headache is severe but denies any symptoms of TIA or stroke.  Past Medical History  Diagnosis Date  . Asthma   . Seasonal allergies   . Endometriosis     Past Surgical History  Procedure Laterality Date  . Cholecystectomy    . Tubal ligation    . Upper gastrointestinal endoscopy    . Colonoscopy      Family History  Problem Relation Age of Onset  . Cancer Mother   . Asthma Father     History  Substance Use Topics  . Smoking status: Current Some Day Smoker -- 0.50 packs/day    Types: Cigarettes  .  Smokeless tobacco: Never Used  . Alcohol Use: No    OB History   Grav Para Term Preterm Abortions TAB SAB Ect Mult Living                  Review of Systems Ten systems reviewed and are negative for acute change, except as noted in the HPI.   Allergies  Chocolate; Onion; Iodine; Miconazole nitrate; and Morphine and related  Home Medications   Current Outpatient Rx  Name  Route  Sig  Dispense  Refill  . albuterol (PROVENTIL HFA;VENTOLIN HFA) 108 (90 BASE) MCG/ACT inhaler   Inhalation   Inhale 2 puffs into the lungs every 6 (six) hours as needed. For wheezing or shortness of breath         . diphenhydrAMINE (BENADRYL) 25 MG tablet   Oral   Take 50 mg by mouth 2 (two) times daily as needed. For allergies         . fexofenadine (ALLEGRA) 180 MG tablet   Oral   Take 180 mg by mouth daily.         . fluconazole (DIFLUCAN) 150 MG tablet   Oral   Take 1 tablet (150 mg total) by mouth once.   1 tablet   0   . Fluticasone-Salmeterol (ADVAIR) 250-50 MCG/DOSE AEPB   Inhalation   Inhale 1 puff into the lungs 2 (two) times daily as needed. For severe asthma attacks         .  HYDROcodone-acetaminophen (NORCO/VICODIN) 5-325 MG per tablet   Oral   Take 2 tablets by mouth every 4 (four) hours as needed for pain.   16 tablet   0   . oxyCODONE-acetaminophen (PERCOCET/ROXICET) 5-325 MG per tablet   Oral   Take 1 tablet by mouth every 6 (six) hours as needed for pain.   12 tablet   0   . sulfamethoxazole-trimethoprim (SEPTRA DS) 800-160 MG per tablet   Oral   Take 1 tablet by mouth every 12 (twelve) hours.   20 tablet   0     BP 132/82  Pulse 102  Temp(Src) 98.2 F (36.8 C)  Resp 18  Ht 5\' 7"  (1.702 m)  Wt 190 lb (86.183 kg)  BMI 29.75 kg/m2  SpO2 100%  LMP 02/03/2013  Physical Exam  Nursing note and vitals reviewed. Constitutional: She is oriented to person, place, and time. She appears well-developed and well-nourished. No distress.  Ill appearing  patient.  Glassy eye.  She appears to be actively working to breathe.  HENT:  Head: Normocephalic and atraumatic.  Eyes: Conjunctivae are normal. No scleral icterus.  Neck: Normal range of motion.  Cardiovascular: Normal rate, regular rhythm and normal heart sounds.  Exam reveals no gallop and no friction rub.   No murmur heard. Pulmonary/Chest: Effort normal. No respiratory distress. She has wheezes (diffuse inspiratory and expiratory wheezes throughout all lung fields.  Air movement is decreased.  She is a prolonged expiratory phase.). She has no rales. She exhibits no tenderness.  Abdominal: Soft. Bowel sounds are normal. She exhibits no distension and no mass. There is tenderness ( suprapubic tenderness). There is no guarding.  Neurological: She is alert and oriented to person, place, and time. She has normal reflexes. No cranial nerve deficit. Coordination normal.  Skin: Skin is warm and dry. No rash noted.    ED Course  Procedures (including critical care time)  Labs Reviewed  URINALYSIS, ROUTINE W REFLEX MICROSCOPIC - Abnormal; Notable for the following:    Color, Urine AMBER (*)    APPearance CLOUDY (*)    Hgb urine dipstick LARGE (*)    Ketones, ur 15 (*)    Protein, ur 30 (*)    Nitrite POSITIVE (*)    Leukocytes, UA LARGE (*)    All other components within normal limits  URINE MICROSCOPIC-ADD ON - Abnormal; Notable for the following:    Squamous Epithelial / LPF FEW (*)    Bacteria, UA MANY (*)    All other components within normal limits  CBC WITH DIFFERENTIAL - Abnormal; Notable for the following:    WBC 13.0 (*)    Neutro Abs 9.7 (*)    Monocytes Absolute 1.1 (*)    All other components within normal limits  BASIC METABOLIC PANEL - Abnormal; Notable for the following:    Potassium 3.4 (*)    Glucose, Bld 106 (*)    All other components within normal limits  URINE CULTURE  PREGNANCY, URINE   Dg Chest 2 View  02/05/2013  *RADIOLOGY REPORT*  Clinical Data: Chest  pain and cough.  CHEST - 2 VIEW  Comparison: 01/02/2012  Findings: There is slight peribronchial thickening consistent with bronchitis.  Heart size and vascularity are normal.  No infiltrates or effusions.  No osseous abnormality.  IMPRESSION: Slight bronchitic changes.   Original Report Authenticated By: Francene Boyers, M.D.      1. UTI (lower urinary tract infection)   2. Wheezing  MDM  6:17 PM BP 132/82  Pulse 102  Temp(Src) 98.2 F (36.8 C)  Resp 18  Ht 5\' 7"  (1.702 m)  Wt 190 lb (86.183 kg)  BMI 29.75 kg/m2  SpO2 100%  LMP 02/03/2013 Labs show patient has a urinary tract infection.  She she also appears to have severe wheezing.  We'll obtain a chest x-ray and begin nebulizer treatment.  Did not feel that patient needs a pelvic exam at this time.  She denies any vaginal symptoms.  I suspect her present symptoms are due to her urinary tract infection we'll treat with IV ceftriaxone.     8:31 PM paitent wheezing has resolved. Her pain is much better.  WIll d/c sith antibiotic, and diflucan, The patient appears reasonably screened and/or stabilized for discharge and I doubt any other medical condition or other [facility] requiring further screening, evaluation, or treatment in the ED at this time prior to discharge.    Arthor Captain, PA-C 02/05/13 2037

## 2013-02-05 NOTE — Patient Instructions (Signed)
Instructed patient on the proper use of administering albuteral mdi via aerochamber patient tolerated well 

## 2013-02-05 NOTE — ED Notes (Signed)
Pt c/o abd pain x 3 days after starting period

## 2013-02-06 NOTE — ED Provider Notes (Signed)
Medical screening examination/treatment/procedure(s) were performed by non-physician practitioner and as supervising physician I was immediately available for consultation/collaboration.   Carleene Cooper III, MD 02/06/13 [phone]

## 2013-02-07 LAB — URINE CULTURE

## 2013-02-08 ENCOUNTER — Telehealth (HOSPITAL_COMMUNITY): Payer: Self-pay | Admitting: Emergency Medicine

## 2013-02-08 NOTE — ED Notes (Signed)
+  Urine. Patient given Keflex. Resistant. Chart sent to EDP office for review. °

## 2013-02-08 NOTE — ED Notes (Signed)
Patient has +Urine culture. °

## 2013-02-09 ENCOUNTER — Telehealth (HOSPITAL_COMMUNITY): Payer: Self-pay | Admitting: Emergency Medicine

## 2013-02-09 NOTE — ED Notes (Signed)
Chart returned from EDP office. Per Elpidio Anis PA-C, Septra DS. 1 BID x 7 days. #14,

## 2013-02-12 NOTE — ED Notes (Signed)
[employer] customer unavailable @ [phone]

## 2013-02-15 ENCOUNTER — Telehealth (HOSPITAL_COMMUNITY): Payer: Self-pay | Admitting: Emergency Medicine

## 2013-02-15 NOTE — ED Notes (Signed)
Unable to contact patient via phone. Sent letter. °

## 2013-03-14 ENCOUNTER — Emergency Department (HOSPITAL_BASED_OUTPATIENT_CLINIC_OR_DEPARTMENT_OTHER): Payer: Self-pay

## 2013-03-14 ENCOUNTER — Encounter (HOSPITAL_BASED_OUTPATIENT_CLINIC_OR_DEPARTMENT_OTHER): Payer: Self-pay | Admitting: Emergency Medicine

## 2013-03-14 ENCOUNTER — Emergency Department (HOSPITAL_BASED_OUTPATIENT_CLINIC_OR_DEPARTMENT_OTHER)
Admission: EM | Admit: 2013-03-14 | Discharge: 2013-03-14 | Disposition: A | Discharge status: Home / Self Care | Payer: Self-pay | Attending: Emergency Medicine | Admitting: Emergency Medicine

## 2013-03-14 DIAGNOSIS — Z8742 Personal history of other diseases of the female genital tract: Secondary | ICD-10-CM | POA: Insufficient documentation

## 2013-03-14 DIAGNOSIS — N83209 Unspecified ovarian cyst, unspecified side: Secondary | ICD-10-CM | POA: Insufficient documentation

## 2013-03-14 DIAGNOSIS — Z3202 Encounter for pregnancy test, result negative: Secondary | ICD-10-CM | POA: Insufficient documentation

## 2013-03-14 DIAGNOSIS — Z79899 Other long term (current) drug therapy: Secondary | ICD-10-CM | POA: Insufficient documentation

## 2013-03-14 DIAGNOSIS — Z9851 Tubal ligation status: Secondary | ICD-10-CM | POA: Insufficient documentation

## 2013-03-14 DIAGNOSIS — F17200 Nicotine dependence, unspecified, uncomplicated: Secondary | ICD-10-CM | POA: Insufficient documentation

## 2013-03-14 DIAGNOSIS — Z9089 Acquired absence of other organs: Secondary | ICD-10-CM | POA: Insufficient documentation

## 2013-03-14 DIAGNOSIS — J45909 Unspecified asthma, uncomplicated: Secondary | ICD-10-CM | POA: Insufficient documentation

## 2013-03-14 HISTORY — DX: Unspecified ovarian cyst, unspecified side: N83.209

## 2013-03-14 HISTORY — DX: Malignant (primary) neoplasm, unspecified: C80.1

## 2013-03-14 LAB — URINALYSIS, ROUTINE W REFLEX MICROSCOPIC
Bilirubin Urine: NEGATIVE
Glucose, UA: NEGATIVE mg/dL
Leukocytes, UA: NEGATIVE
Nitrite: NEGATIVE
Specific Gravity, Urine: 1.017 (ref 1.005–1.030)
pH: 6 (ref 5.0–8.0)

## 2013-03-14 LAB — CBC WITH DIFFERENTIAL/PLATELET
HCT: 40.3 % (ref 36.0–46.0)
Hemoglobin: 13.8 g/dL (ref 12.0–15.0)
Lymphs Abs: 3.1 10*3/uL (ref 0.7–4.0)
Monocytes Relative: 12 % (ref 3–12)
Neutro Abs: 6.6 10*3/uL (ref 1.7–7.7)
Neutrophils Relative %: 59 % (ref 43–77)
RBC: 4.3 MIL/uL (ref 3.87–5.11)

## 2013-03-14 LAB — URINE MICROSCOPIC-ADD ON

## 2013-03-14 LAB — COMPREHENSIVE METABOLIC PANEL
Alkaline Phosphatase: 50 U/L (ref 39–117)
BUN: 6 mg/dL (ref 6–23)
CO2: 28 mEq/L (ref 19–32)
Chloride: 102 mEq/L (ref 96–112)
GFR calc Af Amer: >90 mL/min (ref >90)
GFR calc non Af Amer: >90 mL/min (ref >90)
Glucose, Bld: 86 mg/dL (ref 70–99)
Potassium: 3.3 mEq/L — ABNORMAL LOW (ref 3.5–5.1)
Total Bilirubin: 0.5 mg/dL (ref 0.3–1.2)

## 2013-03-14 LAB — PREGNANCY, URINE: Preg Test, Ur: NEGATIVE

## 2013-03-14 LAB — LIPASE, BLOOD: Lipase: 30 U/L (ref 11–59)

## 2013-03-14 MED ORDER — KETOROLAC TROMETHAMINE 30 MG/ML IJ SOLN
30.0000 mg | Freq: Once | INTRAMUSCULAR | Status: AC
  Administered 2013-03-14: 30 mg via INTRAVENOUS
  Filled 2013-03-14: qty 1

## 2013-03-14 MED ORDER — OXYCODONE-ACETAMINOPHEN 5-325 MG PO TABS: 2.0000 | ORAL_TABLET | ORAL | Status: DC | PRN

## 2013-03-14 MED ORDER — HYDROMORPHONE HCL PF 1 MG/ML IJ SOLN
1.0000 mg | Freq: Once | INTRAMUSCULAR | Status: AC
  Administered 2013-03-14: 1 mg via INTRAVENOUS
  Filled 2013-03-14: qty 1

## 2013-03-14 MED ORDER — ONDANSETRON HCL 4 MG/2ML IJ SOLN
4.0000 mg | Freq: Once | INTRAMUSCULAR | Status: AC
  Administered 2013-03-14: 4 mg via INTRAVENOUS
  Filled 2013-03-14: qty 2

## 2013-03-14 NOTE — ED Notes (Signed)
MD at bedside giving test results and plan of care. 

## 2013-03-14 NOTE — ED Provider Notes (Signed)
History     CSN: [id number]  Arrival date & time 03/14/13  2206   First MD Initiated Contact with Patient 03/14/13 2216      Chief Complaint  Patient presents with  . Flank Pain    (Consider location/radiation/quality/duration/timing/severity/associated sxs/prior treatment) HPI Comments: Patient presents with a 5 day history of pain in the right flank and right lower abdomen.  She denies any urinary complaints.  No bowel complaints.  No fevers or chills.  History of cholecystectomy 17 years ago and tubal ligation in the past.   Patient is a 37 y.o. female presenting with flank pain. The history is provided by the patient.  Flank Pain This is a new problem. Episode onset: 4 days ago. Episode frequency: intermittently. The problem has been gradually worsening. Associated symptoms include abdominal pain. Nothing aggravates the symptoms. Nothing relieves the symptoms. She has tried a warm compress for the symptoms. The treatment provided no relief.    Past Medical History  Diagnosis Date  . Asthma   . Seasonal allergies   . Endometriosis     Past Surgical History  Procedure Laterality Date  . Cholecystectomy    . Tubal ligation    . Upper gastrointestinal endoscopy    . Colonoscopy      Family History  Problem Relation Age of Onset  . Cancer Mother   . Asthma Father     History  Substance Use Topics  . Smoking status: Current Some Day Smoker -- 0.50 packs/day    Types: Cigarettes  . Smokeless tobacco: Never Used  . Alcohol Use: No    OB History   Grav Para Term Preterm Abortions TAB SAB Ect Mult Living                  Review of Systems  Gastrointestinal: Positive for abdominal pain.  Genitourinary: Positive for flank pain.  All other systems reviewed and are negative.    Allergies  Chocolate; Onion; Iodine; Miconazole nitrate; and Morphine and related  Home Medications   Current Outpatient Rx  Name  Route  Sig  Dispense  Refill  . albuterol  (PROVENTIL HFA;VENTOLIN HFA) 108 (90 BASE) MCG/ACT inhaler   Inhalation   Inhale 2 puffs into the lungs every 6 (six) hours as needed. For wheezing or shortness of breath         . diphenhydrAMINE (BENADRYL) 25 MG tablet   Oral   Take 50 mg by mouth 2 (two) times daily as needed. For allergies         . fexofenadine (ALLEGRA) 180 MG tablet   Oral   Take 180 mg by mouth daily.         . Fluticasone-Salmeterol (ADVAIR) 250-50 MCG/DOSE AEPB   Inhalation   Inhale 1 puff into the lungs 2 (two) times daily as needed. For severe asthma attacks         . ibuprofen (ADVIL,MOTRIN) 800 MG tablet   Oral   Take 800 mg by mouth as needed for pain.         . cephALEXin (KEFLEX) 500 MG capsule      2 caps po bid x 7 days   28 capsule   0   . fluconazole (DIFLUCAN) 150 MG tablet   Oral   Take 1 tablet (150 mg total) by mouth once.   1 tablet   0   . fluconazole (DIFLUCAN) 150 MG tablet   Oral   Take 1 tablet (150 mg total) by mouth  daily.   2 tablet   0   . HYDROcodone-acetaminophen (NORCO/VICODIN) 5-325 MG per tablet   Oral   Take 2 tablets by mouth every 4 (four) hours as needed for pain.   16 tablet   0   . oxyCODONE-acetaminophen (PERCOCET/ROXICET) 5-325 MG per tablet   Oral   Take 1 tablet by mouth every 6 (six) hours as needed for pain.   12 tablet   0   . sulfamethoxazole-trimethoprim (SEPTRA DS) 800-160 MG per tablet   Oral   Take 1 tablet by mouth every 12 (twelve) hours.   20 tablet   0     BP 129/79  Pulse 88  Temp(Src) 97.9 F (36.6 C) (Oral)  Resp 18  Ht 5\' 7"  (1.702 m)  Wt 180 lb (81.647 kg)  BMI 28.19 kg/m2  SpO2 98%  LMP 03/03/2013  Physical Exam  Nursing note and vitals reviewed. Constitutional: She is oriented to person, place, and time. She appears well-developed and well-nourished. No distress.  HENT:  Head: Normocephalic and atraumatic.  Neck: Normal range of motion. Neck supple.  Cardiovascular: Normal rate and regular rhythm.   Exam reveals no gallop and no friction rub.   No murmur heard. Pulmonary/Chest: Effort normal and breath sounds normal. No respiratory distress. She has no wheezes.  Abdominal: Soft. Bowel sounds are normal. She exhibits no distension. There is tenderness.  There is ttp in the right side of the abdomen, from the RUQ to the right pelvis.  There is also right-sided cva ttp.  Musculoskeletal: Normal range of motion.  Neurological: She is alert and oriented to person, place, and time.  Skin: Skin is warm and dry. She is not diaphoretic.    ED Course  Procedures (including critical care time)  Labs Reviewed  PREGNANCY, URINE  URINALYSIS, ROUTINE W REFLEX MICROSCOPIC  CBC WITH DIFFERENTIAL  COMPREHENSIVE METABOLIC PANEL  LIPASE, BLOOD   No results found.   No diagnosis found.    MDM  The workup reveals only a 3.5 cm right ovarian cyst.  There is no sign of appendicitis and no evidence for renal calculus.  She is feeling better with the meds given.  Will discharge with pain meds, prn follow up if worsening or does not improve.        Geoffery Lyons, MD 03/14/13 [phone]

## 2013-03-14 NOTE — ED Notes (Signed)
MD at bedside. 

## 2013-03-14 NOTE — ED Notes (Signed)
Right flank pain since sun

## 2013-03-18 ENCOUNTER — Telehealth (HOSPITAL_COMMUNITY): Payer: Self-pay | Admitting: Emergency Medicine

## 2013-03-18 NOTE — ED Notes (Signed)
No response to letter sent after 30 days. Chart sent to Medical Records. °

## 2013-08-18 ENCOUNTER — Encounter (HOSPITAL_BASED_OUTPATIENT_CLINIC_OR_DEPARTMENT_OTHER): Payer: Self-pay | Admitting: Emergency Medicine

## 2013-08-18 ENCOUNTER — Emergency Department (HOSPITAL_BASED_OUTPATIENT_CLINIC_OR_DEPARTMENT_OTHER)
Admission: EM | Admit: 2013-08-18 | Discharge: 2013-08-18 | Disposition: A | Discharge status: Home / Self Care | Payer: Self-pay | Attending: Emergency Medicine | Admitting: Emergency Medicine

## 2013-08-18 ENCOUNTER — Emergency Department (HOSPITAL_BASED_OUTPATIENT_CLINIC_OR_DEPARTMENT_OTHER): Payer: Self-pay

## 2013-08-18 DIAGNOSIS — R197 Diarrhea, unspecified: Secondary | ICD-10-CM | POA: Insufficient documentation

## 2013-08-18 DIAGNOSIS — Z8742 Personal history of other diseases of the female genital tract: Secondary | ICD-10-CM | POA: Insufficient documentation

## 2013-08-18 DIAGNOSIS — R509 Fever, unspecified: Secondary | ICD-10-CM | POA: Insufficient documentation

## 2013-08-18 DIAGNOSIS — Z859 Personal history of malignant neoplasm, unspecified: Secondary | ICD-10-CM | POA: Insufficient documentation

## 2013-08-18 DIAGNOSIS — R1110 Vomiting, unspecified: Secondary | ICD-10-CM | POA: Insufficient documentation

## 2013-08-18 DIAGNOSIS — Z79899 Other long term (current) drug therapy: Secondary | ICD-10-CM | POA: Insufficient documentation

## 2013-08-18 DIAGNOSIS — F17200 Nicotine dependence, unspecified, uncomplicated: Secondary | ICD-10-CM | POA: Insufficient documentation

## 2013-08-18 DIAGNOSIS — J45909 Unspecified asthma, uncomplicated: Secondary | ICD-10-CM | POA: Insufficient documentation

## 2013-08-18 DIAGNOSIS — L0201 Cutaneous abscess of face: Secondary | ICD-10-CM | POA: Insufficient documentation

## 2013-08-18 DIAGNOSIS — L03211 Cellulitis of face: Secondary | ICD-10-CM | POA: Insufficient documentation

## 2013-08-18 DIAGNOSIS — IMO0002 Reserved for concepts with insufficient information to code with codable children: Secondary | ICD-10-CM | POA: Insufficient documentation

## 2013-08-18 DIAGNOSIS — J069 Acute upper respiratory infection, unspecified: Secondary | ICD-10-CM | POA: Insufficient documentation

## 2013-08-18 DIAGNOSIS — R6883 Chills (without fever): Secondary | ICD-10-CM | POA: Insufficient documentation

## 2013-08-18 DIAGNOSIS — Z792 Long term (current) use of antibiotics: Secondary | ICD-10-CM | POA: Insufficient documentation

## 2013-08-18 MED ORDER — SULFAMETHOXAZOLE-TRIMETHOPRIM 800-160 MG PO TABS: 1.0000 | ORAL_TABLET | Freq: Two times a day (BID) | ORAL | Status: AC

## 2013-08-18 MED ORDER — HYDROCODONE-ACETAMINOPHEN 5-325 MG PO TABS
1.0000 | ORAL_TABLET | Freq: Once | ORAL | Status: AC
  Administered 2013-08-18: 1 via ORAL
  Filled 2013-08-18: qty 1

## 2013-08-18 MED ORDER — ALBUTEROL SULFATE (5 MG/ML) 0.5% IN NEBU
5.0000 mg | INHALATION_SOLUTION | Freq: Once | RESPIRATORY_TRACT | Status: AC
  Administered 2013-08-18: 5 mg via RESPIRATORY_TRACT
  Filled 2013-08-18: qty 1

## 2013-08-18 MED ORDER — TRIAMCINOLONE ACETONIDE 55 MCG/ACT NA AERO: 2.0000 | INHALATION_SPRAY | Freq: Every day | NASAL | Status: AC

## 2013-08-18 MED ORDER — HYDROCODONE-ACETAMINOPHEN 5-325 MG PO TABS: 2.0000 | ORAL_TABLET | Freq: Four times a day (QID) | ORAL | Status: DC | PRN

## 2013-08-18 MED ORDER — FLUCONAZOLE 200 MG PO TABS: 200.0000 mg | ORAL_TABLET | Freq: Every day | ORAL | Status: AC

## 2013-08-18 NOTE — ED Notes (Signed)
Pt c/o URI symptoms x 2 days 

## 2013-08-18 NOTE — ED Provider Notes (Signed)
CSN: [id number]     Arrival date & time 08/18/13  2200 History   None    This chart was scribed for Gerhard Munch, MD by Arlan Organ, ED Scribe. This patient was seen in room MH07/MH07 and the patient's care was started 10:04 PM.   No chief complaint on file.  The history is provided by the patient. No language interpreter was used.   HPI Comments: Ellen Gillespie is a 37 y.o. female who presents to the Emergency Department complaining of URI that started 1 week ago, but has worsened in the last 2 days. She states she is experiencing chills, cough, congestion, fever, drainage, diarrhea, and emesis. Pt states she took tylenol prior to arrival with no  Relief. Pt has also trying Allegra and mucinex throughout the week with no relief. Pt denies confusion Pt states she current. Pt is currently on antibiotics which she has been taking for 3 days now.    Past Medical History  Diagnosis Date  . Asthma   . Seasonal allergies   . Endometriosis   . Ovarian cyst   . Cancer    Past Surgical History  Procedure Laterality Date  . Cholecystectomy    . Tubal ligation    . Upper gastrointestinal endoscopy    . Colonoscopy     Family History  Problem Relation Age of Onset  . Cancer Mother   . Asthma Father    History  Substance Use Topics  . Smoking status: Current Some Day Smoker -- 0.50 packs/day    Types: Cigarettes  . Smokeless tobacco: Never Used  . Alcohol Use: No   OB History   Grav Para Term Preterm Abortions TAB SAB Ect Mult Living                 Review of Systems  Constitutional:       Per HPI, otherwise negative  HENT:       Per HPI, otherwise negative  Respiratory:       Per HPI, otherwise negative  Cardiovascular:       Per HPI, otherwise negative  Gastrointestinal: Negative for vomiting.  Endocrine:       Negative aside from HPI  Genitourinary:       Neg aside from HPI   Musculoskeletal:       Per HPI, otherwise negative  Skin: Negative.   Neurological:  Negative for syncope.    Allergies  Chocolate; Onion; Iodine; Miconazole nitrate; and Morphine and related  Home Medications   Current Outpatient Rx  Name  Route  Sig  Dispense  Refill  . albuterol (PROVENTIL HFA;VENTOLIN HFA) 108 (90 BASE) MCG/ACT inhaler   Inhalation   Inhale 2 puffs into the lungs every 6 (six) hours as needed. For wheezing or shortness of breath         . cephALEXin (KEFLEX) 500 MG capsule      2 caps po bid x 7 days   28 capsule   0   . diphenhydrAMINE (BENADRYL) 25 MG tablet   Oral   Take 50 mg by mouth 2 (two) times daily as needed. For allergies         . fexofenadine (ALLEGRA) 180 MG tablet   Oral   Take 180 mg by mouth daily.         . fluconazole (DIFLUCAN) 150 MG tablet   Oral   Take 1 tablet (150 mg total) by mouth once.   1 tablet   0   .  fluconazole (DIFLUCAN) 150 MG tablet   Oral   Take 1 tablet (150 mg total) by mouth daily.   2 tablet   0   . Fluticasone-Salmeterol (ADVAIR) 250-50 MCG/DOSE AEPB   Inhalation   Inhale 1 puff into the lungs 2 (two) times daily as needed. For severe asthma attacks         . HYDROcodone-acetaminophen (NORCO/VICODIN) 5-325 MG per tablet   Oral   Take 2 tablets by mouth every 4 (four) hours as needed for pain.   16 tablet   0   . ibuprofen (ADVIL,MOTRIN) 800 MG tablet   Oral   Take 800 mg by mouth as needed for pain.         Marland Kitchen oxyCODONE-acetaminophen (PERCOCET) 5-325 MG per tablet   Oral   Take 2 tablets by mouth every 4 (four) hours as needed for pain.   20 tablet   0   . oxyCODONE-acetaminophen (PERCOCET/ROXICET) 5-325 MG per tablet   Oral   Take 1 tablet by mouth every 6 (six) hours as needed for pain.   12 tablet   0   . sulfamethoxazole-trimethoprim (SEPTRA DS) 800-160 MG per tablet   Oral   Take 1 tablet by mouth every 12 (twelve) hours.   20 tablet   0    There were no vitals taken for this visit. Physical Exam  Nursing note and vitals reviewed. Constitutional:  She is oriented to person, place, and time. She appears well-developed and well-nourished. No distress.  HENT:  Head: Normocephalic and atraumatic.  Nose: No septal deviation or nasal septal hematoma. No epistaxis.  No foreign bodies.    Mouth/Throat: Uvula is midline. No oropharyngeal exudate or tonsillar abscesses.  Uvula midline  No erythema  Tonsils normal  Eyes: Conjunctivae and EOM are normal.  Cardiovascular: Normal rate and regular rhythm.   Pulmonary/Chest: Effort normal and breath sounds normal. No stridor. No respiratory distress.  Abdominal: She exhibits no distension.  Musculoskeletal: She exhibits no edema.  Neurological: She is alert and oriented to person, place, and time. No cranial nerve deficit.  Skin: Skin is warm and dry.  Psychiatric: She has a normal mood and affect.    ED Course  Procedures (including critical care time)  DIAGNOSTIC STUDIES: Oxygen Saturation is 100% on RA, Normal by my interpretation.    COORDINATION OF CARE: 10:04 PM- Will order CXR. Will give albuterol. Discussed treatment plan with pt at bedside and pt agreed to plan.     Labs Review Labs Reviewed - No data to display Imaging Review No results found.  EKG Interpretation   None       MDM  No diagnosis found.  I personally performed the services described in this documentation, which was scribed in my presence. The recorded information has been reviewed and is accurate.   Patient presents with concern of sore throat, cough, nasal pain.  On exam she is awake alert, afebrile, with a patent airway.  Patient's evaluation was largely reassuring, she felt marginally better.  No with some concern for ongoing respiratory infection given her smoking history, as well as possible early cellulitis, patient was started on antibiotics, discharged to follow up with primary care, ENT as needed.  Gerhard Munch, MD 08/19/13 [phone]

## 2013-12-04 ENCOUNTER — Emergency Department (HOSPITAL_BASED_OUTPATIENT_CLINIC_OR_DEPARTMENT_OTHER)
Admission: EM | Admit: 2013-12-04 | Discharge: 2013-12-04 | Disposition: A | Discharge status: Home / Self Care | Payer: Self-pay | Attending: Emergency Medicine | Admitting: Emergency Medicine

## 2013-12-04 ENCOUNTER — Encounter (HOSPITAL_BASED_OUTPATIENT_CLINIC_OR_DEPARTMENT_OTHER): Payer: Self-pay | Admitting: Emergency Medicine

## 2013-12-04 DIAGNOSIS — Z8742 Personal history of other diseases of the female genital tract: Secondary | ICD-10-CM | POA: Insufficient documentation

## 2013-12-04 DIAGNOSIS — Z87828 Personal history of other (healed) physical injury and trauma: Secondary | ICD-10-CM | POA: Insufficient documentation

## 2013-12-04 DIAGNOSIS — M546 Pain in thoracic spine: Secondary | ICD-10-CM | POA: Insufficient documentation

## 2013-12-04 DIAGNOSIS — F17200 Nicotine dependence, unspecified, uncomplicated: Secondary | ICD-10-CM | POA: Insufficient documentation

## 2013-12-04 DIAGNOSIS — R51 Headache: Secondary | ICD-10-CM | POA: Insufficient documentation

## 2013-12-04 DIAGNOSIS — J45909 Unspecified asthma, uncomplicated: Secondary | ICD-10-CM | POA: Insufficient documentation

## 2013-12-04 DIAGNOSIS — M549 Dorsalgia, unspecified: Secondary | ICD-10-CM

## 2013-12-04 DIAGNOSIS — K029 Dental caries, unspecified: Secondary | ICD-10-CM | POA: Insufficient documentation

## 2013-12-04 DIAGNOSIS — M25519 Pain in unspecified shoulder: Secondary | ICD-10-CM | POA: Insufficient documentation

## 2013-12-04 DIAGNOSIS — K089 Disorder of teeth and supporting structures, unspecified: Secondary | ICD-10-CM | POA: Insufficient documentation

## 2013-12-04 DIAGNOSIS — Z79899 Other long term (current) drug therapy: Secondary | ICD-10-CM | POA: Insufficient documentation

## 2013-12-04 DIAGNOSIS — Z859 Personal history of malignant neoplasm, unspecified: Secondary | ICD-10-CM | POA: Insufficient documentation

## 2013-12-04 MED ORDER — PENICILLIN V POTASSIUM 500 MG PO TABS: 500.0000 mg | ORAL_TABLET | Freq: Three times a day (TID) | ORAL | Status: AC

## 2013-12-04 MED ORDER — CYCLOBENZAPRINE HCL 10 MG PO TABS: 10.0000 mg | ORAL_TABLET | Freq: Three times a day (TID) | ORAL | Status: AC | PRN

## 2013-12-04 MED ORDER — TRAMADOL HCL 50 MG PO TABS: 50.0000 mg | ORAL_TABLET | Freq: Four times a day (QID) | ORAL | Status: DC | PRN
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
## 2013-12-04 NOTE — Discharge Instructions (Signed)
Back Pain, Adult Low back pain is very common. About 1 in 5 people have back pain.The cause of low back pain is rarely dangerous. The pain often gets better over time.About half of people with a sudden onset of back pain feel better in just 2 weeks. About 8 in 10 people feel better by 6 weeks.  CAUSES Some common causes of back pain include:  Strain of the muscles or ligaments supporting the spine.  Wear and tear (degeneration) of the spinal discs.  Arthritis.  Direct injury to the back. DIAGNOSIS Most of the time, the direct cause of low back pain is not known.However, back pain can be treated effectively even when the exact cause of the pain is unknown.Answering your caregiver's questions about your overall health and symptoms is one of the most accurate ways to make sure the cause of your pain is not dangerous. If your caregiver needs more information, he or she may order lab work or imaging tests (X-rays or MRIs).However, even if imaging tests show changes in your back, this usually does not require surgery. HOME CARE INSTRUCTIONS For many people, back pain returns.Since low back pain is rarely dangerous, it is often a condition that people can learn to [facility] their own.   Remain active. It is stressful on the back to sit or stand in one place. Do not sit, drive, or stand in one place for more than 30 minutes at a time. Take short walks on level surfaces as soon as pain allows.Try to increase the length of time you walk each day.  Do not stay in bed.Resting more than 1 or 2 days can delay your recovery.  Do not avoid exercise or work.Your body is made to move.It is not dangerous to be active, even though your back may hurt.Your back will likely heal faster if you return to being active before your pain is gone.  Pay attention to your body when you bend and lift. Many people have less discomfortwhen lifting if they bend their knees, keep the load close to their bodies,and  avoid twisting. Often, the most comfortable positions are those that put less stress on your recovering back.  Find a comfortable position to sleep. Use a firm mattress and lie on your side with your knees slightly bent. If you lie on your back, put a pillow under your knees.  Only take over-the-counter or prescription medicines as directed by your caregiver. Over-the-counter medicines to reduce pain and inflammation are often the most helpful.Your caregiver may prescribe muscle relaxant drugs.These medicines help dull your pain so you can more quickly return to your normal activities and healthy exercise.  Put ice on the injured area.  Put ice in a plastic bag.  Place a towel between your skin and the bag.  Leave the ice on for 15-20 minutes, 03-04 times a day for the first 2 to 3 days. After that, ice and heat may be alternated to reduce pain and spasms.  Ask your caregiver about trying back exercises and gentle massage. This may be of some benefit.  Avoid feeling anxious or stressed.Stress increases muscle tension and can worsen back pain.It is important to recognize when you are anxious or stressed and learn ways to manage it.Exercise is a great option. SEEK MEDICAL CARE IF:  You have pain that is not relieved with rest or medicine.  You have pain that does not improve in 1 week.  You have new symptoms.  You are generally not feeling well. SEEK  IMMEDIATE MEDICAL CARE IF:   You have pain that radiates from your back into your legs.  You develop new bowel or bladder control problems.  You have unusual weakness or numbness in your arms or legs.  You develop nausea or vomiting.  You develop abdominal pain.  You feel faint. Document Released: 10/02/2005 Document Revised: 04/02/2012 Document Reviewed: 02/20/2011 [facility] Patient Information 2014 [place], [place].  Dental Pain A tooth ache may be caused by cavities (tooth decay). Cavities expose the nerve of the tooth to  air and hot or cold temperatures. It may come from an infection or abscess (also called a boil or furuncle) around your tooth. It is also often caused by dental caries (tooth decay). This causes the pain you are having. DIAGNOSIS  Your caregiver can diagnose this problem by exam. TREATMENT   If caused by an infection, it may be treated with medications which kill germs (antibiotics) and pain medications as prescribed by your caregiver. Take medications as directed.  Only take over-the-counter or prescription medicines for pain, discomfort, or fever as directed by your caregiver.  Whether the tooth ache today is caused by infection or dental disease, you should see your dentist as soon as possible for further care. SEEK MEDICAL CARE IF: The exam and treatment you received today has been provided on an emergency basis only. This is not a substitute for complete medical or dental care. If your problem worsens or new problems (symptoms) appear, and you are unable to meet with your dentist, call or return to this location. SEEK IMMEDIATE MEDICAL CARE IF:   You have a fever.  You develop redness and swelling of your face, jaw, or neck.  You are unable to open your mouth.  You have severe pain uncontrolled by pain medicine. MAKE SURE YOU:   Understand these instructions.  Will watch your condition.  Will get help right away if you are not doing well or get worse. Document Released: 10/02/2005 Document Revised: 12/25/2011 Document Reviewed: 05/20/2008 [facility] Patient Information 2014 [place].    Emergency Department Resource Guide 1) Find a Doctor and Pay Out of Pocket Although you won't have to find out who is covered by your insurance plan, it is a good idea to ask around and get recommendations. You will then need to call the office and see if the doctor you have chosen will accept you as a new patient and what types of options they offer for patients who are self-pay. Some  doctors offer discounts or will set up payment plans for their patients who do not have insurance, but you will need to ask so you aren't surprised when you get to your appointment.  2) Contact Your Local Health Department Not all health departments have doctors that can see patients for sick visits, but many do, so it is worth a call to see if yours does. If you don't know where your local health department is, you can check in your phone book. The CDC also has a tool to help you locate your state's health department, and many state websites also have listings of all of their local health departments.  3) Find a [facility] If your illness is not likely to be very severe or complicated, you may want to try a walk in clinic. These are popping up all over the country in pharmacies, drugstores, and shopping centers. They're usually staffed by nurse practitioners or physician assistants that have been trained to treat common illnesses and complaints. They're  usually fairly quick and inexpensive. However, if you have serious medical issues or chronic medical problems, these are probably not your best option.  No Primary Care Doctor: - Call Health Connect at  [phone] - they can help you locate a primary care doctor that  accepts your insurance, provides certain services, etc. - Physician Referral Service- [phone]  Chronic Pain Problems: Organization         Address  Phone   Notes  [facility]  [phone] Patients need to be referred by their primary care doctor.   Medication Assistance: Organization         Address  Phone   Notes  [facility] Medication [facility] [address] [place], [place] [address] [phone] --Must be a resident of [place] -- Must have NO insurance coverage whatsoever (no Medicaid/ Medicare, etc.) -- The pt. MUST have a primary care doctor that directs their care regularly and follows them in the  community   MedAssist  [phone]   [employer]  [phone]    Agencies that provide inexpensive medical care: Organization         Address  Phone   Notes  [place]  [phone]   [facility]    [phone]   [facility] [place], [place] [address] [phone]   [place] [address] [place] [phone]   Planned Parenthood    [phone]   [facility]    [phone]   [place] and [place] [address], [place] Phone:  [phone], Fax:  [phone] Hours of Operation:  9 am - 6 pm, M-F.  Also accepts Medicaid/Medicare and self-pay.  [facility] for [place], [address], [place] Phone: [phone], Fax: [phone]. Hours of Operation:  8:30 am - 5:30 pm, M-F.  Also accepts Medicaid and self-pay.  [facility] [place] [address] [place] Phone: [phone]   [place], [place], [place] [phone], Ext. [phone] Mondays & Thursdays: 7-9 AM.  First 15 patients are seen on a first come, first serve basis.    [place] Providers:  Organization         Address  Phone   Notes  [facility] [address], [place] [phone] Also accepts self-pay patients.  [facility] [id number] [place], [place]  [phone]   [place], [address], [place] [phone]   [facility] [address] [place] [phone]   Lucianne Lei [address], [place]   [phone] Only accepts [place] Access [place] patients after they have their name applied to their card.   Self-Pay (no insurance) in [facility]:  Organization         Address  Phone   Notes  Sickle Cell Patients, [facility]  Internal Medicine [place] [phone]   [facility] Urgent Care [place] [phone]   [facility] [place]  [place], [place], [place] [phone]   [facility]/Dr. Osei-Bonsu  [address], [place] or [place], [address], [place] [phone]  Phone number for both [employer] and [place] locations is the same.  Urgent Medical and [facility] [address] [place] [phone]   [facility] [address], [place] or [address] [phone]   [facility] [address] [place] [phone], phone; [phone], fax Sees patients 1st and 3rd Saturday of every month.  Must not qualify for public or private insurance (i.e. Medicaid, Medicare, [place] Health Choice, Veterans' Benefits)  Household income should be no more than 200% of the poverty level The clinic cannot treat you if you are pregnant or think you are pregnant  Sexually transmitted diseases are not treated at the clinic.    Dental Care: Organization         Address  Phone  Notes  [facility] Department of [facility] [phone] Accepts children up to age 57 who are enrolled in [place] or [place]; pregnant women with a Medicaid card; and children who have applied for Medicaid or [place] Health Choice, but were declined, whose parents can pay a reduced fee at time of service.  [facility] Department of [facility]  [address], [place] [phone] Accepts children up to age 46 who are enrolled in [place] or [place]; pregnant women with a Medicaid card; and children who have applied for Medicaid or [place] Health Choice, but were declined, whose parents can pay a reduced fee at time of service.  [place] Adult Dental Access PROGRAM  [place] [phone] Patients are seen by appointment only. Walk-ins are not accepted. [place] will see patients 64 years of age and older. Monday - Tuesday (8am-5pm) Most Wednesdays (8:30-5pm) $30 per visit, cash only  [facility] Adult Dental Access PROGRAM  [address], [facility] [phone] Patients are seen by appointment only. Walk-ins are not accepted. [place] will see patients 45 years of age and older. One Wednesday Evening (Monthly: Volunteer Based).  $30 per visit, cash only  [place]  [phone] for adults; Children under age 58, call Graduate Pediatric Dentistry at [phone]. Children aged 3-14, please call [phone] to request a pediatric application.  Dental services are provided in all areas of dental care including fillings, crowns and bridges, complete and partial dentures, implants, gum treatment, root canals, and extractions. Preventive care is also provided. Treatment is provided to both adults and children. Patients are selected via a lottery and there is often a waiting list.   [facility] [address], [place]  [phone] [email or web address]   [facility] [address] [place], [place] [phone], Ext. [phone] Second and Fourth Thursday of each month, opens at 6:30 AM; Clinic ends at 9 AM.  Patients are seen on a first-come first-served basis, and a limited number are seen during each clinic.   [facility]  [address] Hillard Danker [place], [place] [phone]   Eligibility Requirements You must have lived in [place], [place], or [place] counties for at least the last three months.   You cannot be eligible for state or federal sponsored [employer], including [employer], [place], or [employer].   You generally cannot be eligible for healthcare insurance through your employer.    How to apply: Eligibility screenings are held every  Tuesday and Wednesday afternoon from 1:00  pm until 4:00 pm. You do not need an appointment for the interview!  [facility] [address] [place], [place]   [place]  [place] Department  [place]  [phone]    Behavioral Health Resources in the Community: Intensive Outpatient Programs Organization         Address  Phone  Notes  [place]. [address] [place], [place] [phone]   [facility] Outpatient [address], [place], [place]   ADS: Alcohol & Drug Svcs [address], [place], [place]   [place] [address]  [place], [place] or [phone]   Substance Abuse Resources Organization         Address  Phone  Notes  Alcohol and Drug Services  [phone]   [place]  [phone]   [place]   Chinita Pester  [phone]   Residential & Outpatient Substance Abuse Program  [phone]   Psychological Services Organization         Address  Phone  Notes  [facility] [place]  [place]  [phone]   [place] [address] [place] or [phone]    Mobile Crisis Teams Organization         Address  Phone  Notes  [facility]rapeutic Alternatives, Mobile Crisis Care Unit  [phone]   [facility] Psychotherapeutic Services  [address] [place], [place]   [place] Levels [address] [place] [phone]    Self-Help/Support Groups Organization         Address  Phone             Notes  Combine. of [place] - variety of support groups  [place] Call for more information  Narcotics Anonymous (NA), Caring Services [address], [employer] [place]  2 meetings at this location    Special educational needs teacher         Address  Phone  Notes  ASAP Residential Treatment [place],    [place]  [phone]   [facility]  [address], [place] [id number], [place], [place]   [place], [place] [phone] Admissions: 8am-3pm M-F  Incentives Substance [place] [address],    [place], [place] [phone]   [facility] [address] [place], [place], [place]   [facility] [address],  [place], [place]   [facility] Programs - Intensive Outpatient [address]een [address], [place], [place]   [facility].,  [place], [place] [phone] or [phone]   [facility]idential Treatment Services (RTS) [address] [place], [place] Accepts Medicaid  [place] [address],  [place] [phone] Substance Abuse/Addiction Treatment   [facility] Organization         Address  Phone  Notes  [facility]  [phone]   Domenic Schwab, PhD [address] [place]lis [place], [place]   [phone] or [phone]   [place], [place] [phone]   [place] [address] [place], [place] [phone] Insurance/Medicaid/sponsorship through [employer] and Families [address],  Kristeen Mans [address] [place] [phone] [place], [place] [phone]    Dr. Adele Schilder  [phone]   [facility] Dept. 1) [address] [place] 2) [place] 3)  [place], [place] [phone]  [phone]   [place] [phone] or [phone] (After  Hours)
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
## 2013-12-05 ENCOUNTER — Emergency Department (HOSPITAL_BASED_OUTPATIENT_CLINIC_OR_DEPARTMENT_OTHER)
Admission: EM | Admit: 2013-12-05 | Discharge: 2013-12-05 | Disposition: A | Discharge status: Home / Self Care | Payer: Self-pay | Attending: Emergency Medicine | Admitting: Emergency Medicine

## 2013-12-05 ENCOUNTER — Encounter (HOSPITAL_BASED_OUTPATIENT_CLINIC_OR_DEPARTMENT_OTHER): Payer: Self-pay | Admitting: Emergency Medicine

## 2013-12-05 DIAGNOSIS — F17200 Nicotine dependence, unspecified, uncomplicated: Secondary | ICD-10-CM | POA: Insufficient documentation

## 2013-12-05 DIAGNOSIS — M545 Low back pain, unspecified: Secondary | ICD-10-CM | POA: Insufficient documentation

## 2013-12-05 DIAGNOSIS — Z79899 Other long term (current) drug therapy: Secondary | ICD-10-CM | POA: Insufficient documentation

## 2013-12-05 DIAGNOSIS — IMO0002 Reserved for concepts with insufficient information to code with codable children: Secondary | ICD-10-CM | POA: Insufficient documentation

## 2013-12-05 DIAGNOSIS — M5380 Other specified dorsopathies, site unspecified: Secondary | ICD-10-CM | POA: Insufficient documentation

## 2013-12-05 DIAGNOSIS — G8929 Other chronic pain: Secondary | ICD-10-CM | POA: Insufficient documentation

## 2013-12-05 DIAGNOSIS — J45909 Unspecified asthma, uncomplicated: Secondary | ICD-10-CM | POA: Insufficient documentation

## 2013-12-05 DIAGNOSIS — N898 Other specified noninflammatory disorders of vagina: Secondary | ICD-10-CM | POA: Insufficient documentation

## 2013-12-05 DIAGNOSIS — M62838 Other muscle spasm: Secondary | ICD-10-CM | POA: Insufficient documentation

## 2013-12-05 DIAGNOSIS — Z87828 Personal history of other (healed) physical injury and trauma: Secondary | ICD-10-CM | POA: Insufficient documentation

## 2013-12-05 DIAGNOSIS — Z859 Personal history of malignant neoplasm, unspecified: Secondary | ICD-10-CM | POA: Insufficient documentation

## 2013-12-05 DIAGNOSIS — M5412 Radiculopathy, cervical region: Secondary | ICD-10-CM | POA: Insufficient documentation

## 2013-12-05 MED ORDER — HYDROCODONE-ACETAMINOPHEN 5-325 MG PO TABS: 1.0000 | ORAL_TABLET | Freq: Four times a day (QID) | ORAL | Status: DC | PRN

## 2013-12-05 MED ORDER — PREDNISONE 20 MG PO TABS: 40.0000 mg | ORAL_TABLET | Freq: Every day | ORAL | Status: AC

## 2013-12-05 NOTE — ED Notes (Signed)
Pt was seen in ED last night, prescribed medications but did not fill them "because I have them at home and they do not work".  New appt scheduled with neurologist in March.
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
## 2013-12-05 NOTE — ED Notes (Signed)
Pt c/o back pain, chronic and sts she was seeing Neuro for same and has been out of meds and does not have appt to see neuro for "a while".

## 2014-01-19 ENCOUNTER — Encounter (HOSPITAL_BASED_OUTPATIENT_CLINIC_OR_DEPARTMENT_OTHER): Payer: Self-pay | Admitting: Emergency Medicine

## 2014-01-19 ENCOUNTER — Emergency Department (HOSPITAL_BASED_OUTPATIENT_CLINIC_OR_DEPARTMENT_OTHER)
Admission: EM | Admit: 2014-01-19 | Discharge: 2014-01-19 | Disposition: A | Discharge status: Home / Self Care | Payer: Self-pay | Attending: Emergency Medicine | Admitting: Emergency Medicine

## 2014-01-19 ENCOUNTER — Emergency Department (HOSPITAL_BASED_OUTPATIENT_CLINIC_OR_DEPARTMENT_OTHER): Payer: Self-pay

## 2014-01-19 DIAGNOSIS — Z8541 Personal history of malignant neoplasm of cervix uteri: Secondary | ICD-10-CM | POA: Insufficient documentation

## 2014-01-19 DIAGNOSIS — J45909 Unspecified asthma, uncomplicated: Secondary | ICD-10-CM | POA: Insufficient documentation

## 2014-01-19 DIAGNOSIS — Y929 Unspecified place or not applicable: Secondary | ICD-10-CM | POA: Insufficient documentation

## 2014-01-19 DIAGNOSIS — IMO0002 Reserved for concepts with insufficient information to code with codable children: Secondary | ICD-10-CM | POA: Insufficient documentation

## 2014-01-19 DIAGNOSIS — Y9389 Activity, other specified: Secondary | ICD-10-CM | POA: Insufficient documentation

## 2014-01-19 DIAGNOSIS — Z8742 Personal history of other diseases of the female genital tract: Secondary | ICD-10-CM | POA: Insufficient documentation

## 2014-01-19 DIAGNOSIS — F17200 Nicotine dependence, unspecified, uncomplicated: Secondary | ICD-10-CM | POA: Insufficient documentation

## 2014-01-19 DIAGNOSIS — Z79899 Other long term (current) drug therapy: Secondary | ICD-10-CM | POA: Insufficient documentation

## 2014-01-19 DIAGNOSIS — Z792 Long term (current) use of antibiotics: Secondary | ICD-10-CM | POA: Insufficient documentation

## 2014-01-19 DIAGNOSIS — X500XXA Overexertion from strenuous movement or load, initial encounter: Secondary | ICD-10-CM | POA: Insufficient documentation

## 2014-01-19 MED ORDER — NAPROXEN 500 MG PO TABS: 500.0000 mg | ORAL_TABLET | Freq: Two times a day (BID) | ORAL | Status: AC

## 2014-01-19 MED ORDER — KETOROLAC TROMETHAMINE 30 MG/ML IJ SOLN
30.0000 mg | Freq: Once | INTRAMUSCULAR | Status: AC
  Administered 2014-01-19: 30 mg via INTRAMUSCULAR
  Filled 2014-01-19: qty 2

## 2014-01-19 MED ORDER — METHOCARBAMOL 500 MG PO TABS: 500.0000 mg | ORAL_TABLET | Freq: Two times a day (BID) | ORAL | Status: AC

## 2014-01-19 NOTE — Discharge Instructions (Signed)
Mid-Back Strain  with Rehab   A strain is an injury in which a tendon or muscle is torn. The muscles and tendons of the mid-back are vulnverable to strains. However, these muscles and tendons are very strong and require a great force to be injured. The muscles of the mid-back are responsible for stabilizing the spinal column, as well as spinal twisting (rotation). Strains are classified into three categories. Grade 1 strains cause pain, but the tendon is not lengthened. Grade 2 strains include a lengthened ligament, due to the ligament being stretched or partially ruptured. With grade 2 strains there is still function, although the function may be decreased. Grade 3 strains involve a complete tear of the tendon or muscle, and function is usually impaired.  SYMPTOMS   · Pain in the middle of the back.  · Pain that may affect only one side, and is worse with movement.  · Muscle spasms, and often swelling in the back.  · Loss of strength of the back muscles.  · Crackling sound (crepitation) when the muscles are touched.  CAUSES   Mid-back strains occur when a force is placed on the muscles or tendons that is greater than they can handle. Common causes of injury include:  · Ongoing overuse of the muscle-tendon units in the middle back, usually from incorrect body posture.  · A single violent injury or force applied to the back.  RISK INCREASES WITH:  · Sports that involve twisting forces on the spine or a lot of bending at the waist (football, rugby, weightlifting, bowling, golf, tennis, speed skating, racquetball, swimming, running, gymnastics, diving).  · Poor strength and flexibility.  · Failure to warm up properly before activity.  · Family history of low back pain or disk disorders.  · Previous back injury or surgery (especially fusion).  PREVENTION  · Learn and use proper sports technique.  · Warm up and stretch properly before activity.  · Allow for adequate recovery between workouts.  · Maintain physical  fitness:  · Strength, flexibility, and endurance.  · Cardiovascular fitness.  PROGNOSIS   If treated properly, mid-back strains usually heal within 6 weeks.  RELATED COMPLICATIONS   · Frequently recurring symptoms, resulting in a chronic problem. Properly treating the problem the first time decreases frequency of recurrence.  · Chronic inflammation, scarring, and partial muscle-tendon tear.  · Delayed healing or resolution of symptoms, especially if activity is resumed too soon.  · Prolonged disability.  TREATMENT  Treatment first involves the use of ice and medicine, to reduce pain and inflammation. As the pain begins to subside, you may begin strengthening and stretching exercises to improve body posture and sport technique. These exercises may be performed at home or with a therapist. Severe injuries may require referral to a therapist for further evaluation and treatment, such as ultrasound. Corticosteroid injections may be given to help reduce inflammation. Biofeedback (watching monitors of your body processes) and psychotherapy may also be prescribed. Prolonged bed rest is felt to do more harm than good. Massage may help break the muscle spasms. Sometimes, an injection of cortisone, with or without local anesthetics, may be given to help relieve the pain and spasms.  MEDICATION   · If pain medicine is needed, nonsteroidal anti-inflammatory medicines (aspirin and ibuprofen), or other minor pain relievers (acetaminophen), are often advised.  · Do not take pain medicine for 7 days before surgery.  · Prescription pain relievers may be given, if your caregiver thinks they are needed. Use only   they may only be given a certain number of times. HEAT AND COLD:   Cold treatment (icing) should be  applied for 10 to 15 minutes every 2 to 3 hours for inflammation and pain, and immediately after activity that aggravates your symptoms. Use ice packs or an ice massage.  Heat treatment may be used before performing stretching and strengthening activities prescribed by your caregiver, physical therapist, or athletic trainer. Use a heat pack or a warm water soak. SEEK IMMEDIATE MEDICAL CARE IF:  Symptoms get worse or do not improve in 2 to 4 weeks, despite treatment.  You develop numbness, weakness, or loss of bowel or bladder function.  New, unexplained symptoms develop. (Drugs used in treatment may produce side effects.) EXERCISES RANGE OF MOTION (ROM) AND STRETCHING EXERCISES - Mid-Back Strain These exercises may help you when beginning to rehabilitate your injury. In order to successfully resolve your symptoms, you must improve your posture. These exercises are designed to help reduce the forward-head and rounded-shoulder posture which contributes to this condition. Your symptoms may resolve with or without further involvement from your physician, physical therapist or athletic trainer. While completing these exercises, remember:   Restoring tissue flexibility helps normal motion to return to the joints. This allows healthier, less painful movement and activity.  An effective stretch should be held for at least 30 seconds.  A stretch should never be painful. You should only feel a gentle lengthening or release in the stretched tissue. STRETECH - Axial Extension  Stand or sit on a firm surface. Assume a good posture: chest up, shoulders drawn back, stomach muscles slightly tense, knees unlocked (if standing) and feet hip width apart.  Slowly retract your chin, so your head slides back and your chin slightly lowers. Continue to look straight ahead.  You should feel a gentle stretch in the back of your head. Be certain not to feel an aggressive stretch since this can cause headaches  later.  Hold for __________ seconds. Repeat __________ times. Complete this exercise __________ times per day. RANGE OF MOTION- Upper Thoracic Extension  Sit on a firm chair with a high back. Assume a good posture: chest up, shoulders drawn back, abdominal muscles slightly tense, and feet hip width apart. Place a small pillow or folded towel in the curve of your lower back, if you are having difficulty maintaining good posture.  Gently brace your neck with your hands, allowing your arms to rest on your chest.  Continue to support your neck and slowly extend your back over the chair. You will feel a stretch across your upper back.  Hold __________ seconds. Slowly return to the starting position. Repeat __________ times. Complete this exercise __________ times per day. RANGE OF MOTION- Mid-Thoracic Extension  Roll a towel so that it is about 4 inches in diameter.  Position the towel lengthwise. Lay on the towel so that your spine, but not your shoulder blades, are supported.  You should feel your mid-back arching toward the floor. To increase the stretch, extend your arms away from your body.  Hold for __________ seconds. Repeat exercise __________ times, __________ times per day. STRENGTHENING EXERCISES - Mid-Back Strain These exercises may help you when beginning to rehabilitate your injury. They may resolve your symptoms with or without further involvement from your physician, physical therapist or athletic trainer. While completing these exercises, remember:   Muscles can gain both the endurance and the strength needed for everyday activities through controlled exercises.  Complete these exercises as instructed by  your physician, physical therapist or athletic trainer. Increase the resistance and repetitions only as guided by your caregiver.  You may experience muscle soreness or fatigue, but the pain or discomfort you are trying to eliminate should never worsen during these  exercises. If this pain does worsen, stop and make certain you are following the directions exactly. If the pain is still present after adjustments, discontinue the exercise until you can discuss the trouble with your caregiver. STRENGTHENING Quadruped, Opposite UE/LE Lift  Assume a hands and knees position on a firm surface. Keep your hands under your shoulders and your knees under your hips. You may place padding under your knees for comfort.  Find your neutral spine and gently tense your abdominal muscles so that you can maintain this position. Your shoulders and hips should form a rectangle that is parallel with the floor and is not twisted.  Keeping your trunk steady, lift your right hand no higher than your shoulder and then your left leg no higher than your hip. Make sure you are not holding your breath. Hold this position __________ seconds.  Continuing to keep your abdominal muscles tense and your back steady, slowly return to your starting position. Repeat with the opposite arm and leg. Repeat __________ times. Complete this exercise __________ times per day.  STRENGTH - Shoulder Extensors  Secure a rubber exercise band or tubing to a fixed object (table, pole) so that it is at the height of your shoulders when you are either standing, or sitting on a firm armless chair.  With a thumbs-up grip, grasp an end of the band in each hand. Straighten your elbows and lift your hands straight in front of you at shoulder height. Step back away from the secured end of band, until it becomes tense.  Squeezing your shoulder blades together, pull your hands down to the sides of your thighs. Do not allow your hands to go behind you.  Hold for __________ seconds. Slowly ease the tension on the band, as you reverse the directions and return to the starting position. Repeat __________ times. Complete this exercise __________ times per day.  STRENGTH - Horizontal Abductors Choose one of the two  positions to complete this exercise. Prone: lying on stomach:  Lie on your stomach on a firm surface so that your right / left arm overhangs the edge. Rest your forehead on your opposite forearm. With your palm facing the floor and your elbow straight, hold a __________ weight in your hand.  Squeeze your right / left shoulder blade to your mid-back spine and then slowly raise your arm to the height of the bed.  Hold for __________ seconds. Slowly reverse the directions and return to the starting position, controlling the weight as you lower your arm. Repeat __________ times. Complete this exercise __________ times per day. Standing:   Secure a rubber exercise band or tubing, so that it is at the height of your shoulders when you are either standing, or sitting on a firm armless chair.  Grasp an end of the band in each hand and have your palms face each other. Straighten your elbows and lift your hands straight in front of you at shoulder height. Step back away from the secured end of band, until it becomes tense.  Squeeze your shoulder blades together. Keeping your elbows locked and your hands at shoulder height, spread your arms apart, forming a "T" shape with your body. Hold __________ seconds. Slowly ease the tension on the band, as you  reverse the directions and return to the starting position. Repeat __________ times. Complete this exercise __________ times per day. STRENGTH - Scapular Retractors and External Rotators, Rowing  Secure a rubber exercise band or tubing, so that it is at the height of your shoulders when you are either standing, or sitting on a firm armless chair.  With a palm-down grip, grasp an end of the band in each hand. Straighten your elbows and lift your hands straight in front of you at shoulder height. Step back away from the secured end of band, until it becomes tense.  Step 1: Squeeze your shoulder blades together. Bending your elbows, draw your hands to your  chest as if you are rowing a boat. At the end of this motion, your hands and elbow should be at shoulder height and your elbows should be out to your sides.  Step 2: Rotate your shoulder to raise your hands above your head. Your forearms should be vertical and your upper arms should be horizontal.  Hold for __________ seconds. Slowly ease the tension on the band, as you reverse the directions and return to the starting position. Repeat __________ times. Complete this exercise __________ times per day.  POSTURE AND BODY MECHANICS CONSIDERATIONS  Mid-Back Strain Keeping correct posture when sitting, standing or completing your activities will reduce the stress put on different body tissues, allowing injured tissues a chance to heal and limiting painful experiences. The following are general guidelines for improved posture. Your physician or physical therapist will provide you with any instructions specific to your needs. While reading these guidelines, remember:  The exercises prescribed by your provider will help you have the flexibility and strength to maintain correct postures.  The correct posture provides the best environment for your joints to work. All of your joints have less wear and tear when properly supported by a spine with good posture. This means you will experience a healthier, less painful body.  Correct posture must be practiced with all of your activities, especially prolonged sitting and standing. Correct posture is as important when doing repetitive low-stress activities (typing) as it is when doing a single heavy-load activity (lifting). PROPER SITTING POSTURE In order to minimize stress and discomfort on your spine, you must sit with correct posture. Sitting with good posture should be effortless for a healthy body. Returning to good posture is a gradual process. Many people can work toward this most comfortably by using various supports until they have the flexibility and  strength to maintain this posture on their own. When sitting with proper posture, your ears will fall over your shoulders and your shoulders will fall over your hips. You should use the back of the chair to support your upper back. Your lower back will be in a neutral position, just slightly arched. You may place a small pillow or folded towel at the base of your low back for  support.  When working at a desk, create an environment that supports good, upright posture. Without extra support, muscles fatigue and lead to excessive strain on joints and other tissues. Keep these recommendations in mind: CHAIR:  A chair should be able to slide under your desk when your back makes contact with the back of the chair. This allows you to work closely.  The chair's height should allow your eyes to be level with the upper part of your monitor and your hands to be slightly lower than your elbows. BODY POSITION  Your feet should make contact with the  floor. If this is not possible, use a foot rest.  Keep your ears over your shoulders. This will reduce stress on your neck and lower back. INCORRECT SITTING POSTURES If you are feeling tired and unable to assume a healthy sitting posture, do not slouch or slump. This puts excessive strain on your back tissues, causing more damage and pain. Healthier options include:  Using more support, like a lumbar pillow.  Switching tasks to something that requires you to be upright or walking.  Talking a brief walk.  Lying down to rest in a neutral-spine position. CORRECT STANDING POSTURES Proper standing posture should be assumed with all daily activities, even if they only take a few moments, like when brushing your teeth. As in sitting, your ears should fall over your shoulders and your shoulders should fall over your hips. You should keep a slight tension in your abdominal muscles to brace your spine. Your tailbone should point down to the ground, not behind your  body, resulting in an over-extended swayback posture.  INCORRECT STANDING POSTURES Common incorrect standing postures include a forward head, locked knees, and an excessive swayback. WALKING Walk with an upright posture. Your ears, shoulders and hips should all line-up. CORRECT LIFTING TECHNIQUES DO :   Assume a wide stance. This will provide you more stability and the opportunity to get as close as possible to the object which you are lifting.  Tense your abdominals to brace your spine. Bend at the knees and hips. Keeping your back locked in a neutral-spine position, lift using your leg muscles. Lift with your legs, keeping your back straight.  Test the weight of unknown objects before attempting to lift them.  Try to keep your elbows locked down at your sides in order get the best strength from your shoulders when carrying an object.  Always ask for help when lifting heavy or awkward objects. INCORRECT LIFTING TECHNIQUES DO NOT:   Lock your knees when lifting, even if it is a small object.  Bend and twist. Pivot at your feet or move your feet when needing to change directions.  Assume that you can safely pick up even a paperclip without proper posture. Document Released: 10/02/2005 Document Revised: 12/25/2011 Document Reviewed: 01/14/2009 [facility] Patient Information 2014 [place], [place].

## 2014-01-19 NOTE — ED Provider Notes (Signed)
Medical screening examination/treatment/procedure(s) were performed by non-physician practitioner and as supervising physician I was immediately available for consultation/collaboration.     Veryl Speak, MD 01/19/14 [phone]
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
## 2014-01-19 NOTE — ED Notes (Signed)
C/o mid/lower back pain after moving table 2 days ago-steady gait into triage

## 2014-04-23 ENCOUNTER — Encounter: Payer: Self-pay | Admitting: *Deleted

## 2014-04-27 ENCOUNTER — Encounter: Payer: Self-pay | Admitting: Obstetrics & Gynecology

## 2014-06-15 ENCOUNTER — Other Ambulatory Visit (HOSPITAL_COMMUNITY)
Admission: RE | Admit: 2014-06-15 | Discharge: 2014-06-15 | Disposition: A | Discharge status: Home / Self Care | Payer: Self-pay | Source: Ambulatory Visit | Attending: Obstetrics & Gynecology | Admitting: Obstetrics & Gynecology

## 2014-06-15 ENCOUNTER — Encounter: Payer: Self-pay | Admitting: Obstetrics & Gynecology

## 2014-06-15 ENCOUNTER — Telehealth: Payer: Self-pay

## 2014-06-15 ENCOUNTER — Ambulatory Visit (INDEPENDENT_AMBULATORY_CARE_PROVIDER_SITE_OTHER): Payer: Self-pay | Admitting: Obstetrics & Gynecology

## 2014-06-15 VITALS — BP 128/80 | HR 74 | Temp 98.6°F | Ht 67.0 in

## 2014-06-15 DIAGNOSIS — N939 Abnormal uterine and vaginal bleeding, unspecified: Principal | ICD-10-CM | POA: Insufficient documentation

## 2014-06-15 DIAGNOSIS — N926 Irregular menstruation, unspecified: Secondary | ICD-10-CM | POA: Insufficient documentation

## 2014-06-15 DIAGNOSIS — N949 Unspecified condition associated with female genital organs and menstrual cycle: Secondary | ICD-10-CM

## 2014-06-15 DIAGNOSIS — N925 Other specified irregular menstruation: Secondary | ICD-10-CM

## 2014-06-15 DIAGNOSIS — Z0000 Encounter for general adult medical examination without abnormal findings: Secondary | ICD-10-CM

## 2014-06-15 DIAGNOSIS — N938 Other specified abnormal uterine and vaginal bleeding: Secondary | ICD-10-CM

## 2014-06-15 LAB — POCT PREGNANCY, URINE: PREG TEST UR: NEGATIVE

## 2014-06-15 NOTE — Progress Notes (Signed)
Pt was unable to void for test before procedure.

## 2014-06-15 NOTE — Telephone Encounter (Signed)
Patient left today without getting CBC drawn. Called patient and explained need for lab draw-- informed her she could come whenever it is convenient for her prior to next appointment. Patient states she will look over calendar and figure our a ride and call front office staff to schedule. Informed patient that will be OK. No questions or concerns.

## 2014-06-15 NOTE — Progress Notes (Signed)
   Subjective:    Patient ID: Ellen Gillespie, female    DOB: 08/21/1976, 38 y.o.   MRN: [id number]  HPI  38 yo DW G1 P1 (45 yo daughter) here for "tremendous bleeding" for many years. During the month she bleeds most every day, maybe 3 days dry per month. Her hemoglobin 5/15 was normal at 13.8. She has not had a recent gyn u/s. Last pap was prior to 2012.  Review of Systems     Objective:   Physical Exam   UPT negative, consent signed, time out done Cervix prepped with betadine and grasped with a single tooth tenaculum Uterus sounded to 9 cm Pipelle used for 2 passes with a moderate amount of tissue obtained. She tolerated the procedure well.       Assessment & Plan:  Preventative- check pap DUB- schedule gyn u/s

## 2014-06-16 LAB — CYTOLOGY - PAP

## 2014-06-17 ENCOUNTER — Encounter: Payer: Self-pay | Admitting: General Practice

## 2014-06-29 ENCOUNTER — Ambulatory Visit (HOSPITAL_COMMUNITY)
Admission: RE | Admit: 2014-06-29 | Discharge: 2014-06-29 | Disposition: A | Discharge status: Home / Self Care | Payer: Self-pay | Source: Ambulatory Visit | Attending: Obstetrics & Gynecology | Admitting: Obstetrics & Gynecology

## 2014-06-29 ENCOUNTER — Other Ambulatory Visit: Payer: Self-pay

## 2014-06-29 DIAGNOSIS — N83209 Unspecified ovarian cyst, unspecified side: Secondary | ICD-10-CM | POA: Insufficient documentation

## 2014-06-29 DIAGNOSIS — N949 Unspecified condition associated with female genital organs and menstrual cycle: Secondary | ICD-10-CM | POA: Insufficient documentation

## 2014-06-29 DIAGNOSIS — N925 Other specified irregular menstruation: Secondary | ICD-10-CM | POA: Insufficient documentation

## 2014-06-29 DIAGNOSIS — N938 Other specified abnormal uterine and vaginal bleeding: Secondary | ICD-10-CM | POA: Insufficient documentation

## 2014-06-29 DIAGNOSIS — Z9851 Tubal ligation status: Secondary | ICD-10-CM | POA: Insufficient documentation

## 2014-06-29 DIAGNOSIS — C539 Malignant neoplasm of cervix uteri, unspecified: Secondary | ICD-10-CM | POA: Insufficient documentation

## 2014-06-29 LAB — CBC
HCT: 41.6 % (ref 36.0–46.0)
Hemoglobin: 14.4 g/dL (ref 12.0–15.0)
MCH: 31.4 pg (ref 26.0–34.0)
MCHC: 34.6 g/dL (ref 30.0–36.0)
MCV: 90.6 fL (ref 78.0–100.0)
Platelets: 361 K/uL (ref 150–400)
RBC: 4.59 MIL/uL (ref 3.87–5.11)
RDW: 13.6 % (ref 11.5–15.5)
WBC: 11.1 K/uL — ABNORMAL HIGH (ref 4.0–10.5)

## 2014-07-27 ENCOUNTER — Ambulatory Visit: Payer: Self-pay | Admitting: Obstetrics & Gynecology

## 2014-08-17 ENCOUNTER — Encounter: Payer: Self-pay | Admitting: Obstetrics & Gynecology

## 2014-08-26 ENCOUNTER — Ambulatory Visit: Payer: Self-pay | Admitting: Obstetrics & Gynecology

## 2014-09-15 IMAGING — CR DG LUMBAR SPINE COMPLETE 4+V
5 series · 5 of 5 positions shown · non-contrast
Comparison: CT, 03/14/2013

CLINICAL DATA: Low back pain after moving furniture.

EXAM:
LUMBAR SPINE - COMPLETE 4+ VIEW

[t l-spine a.p.]
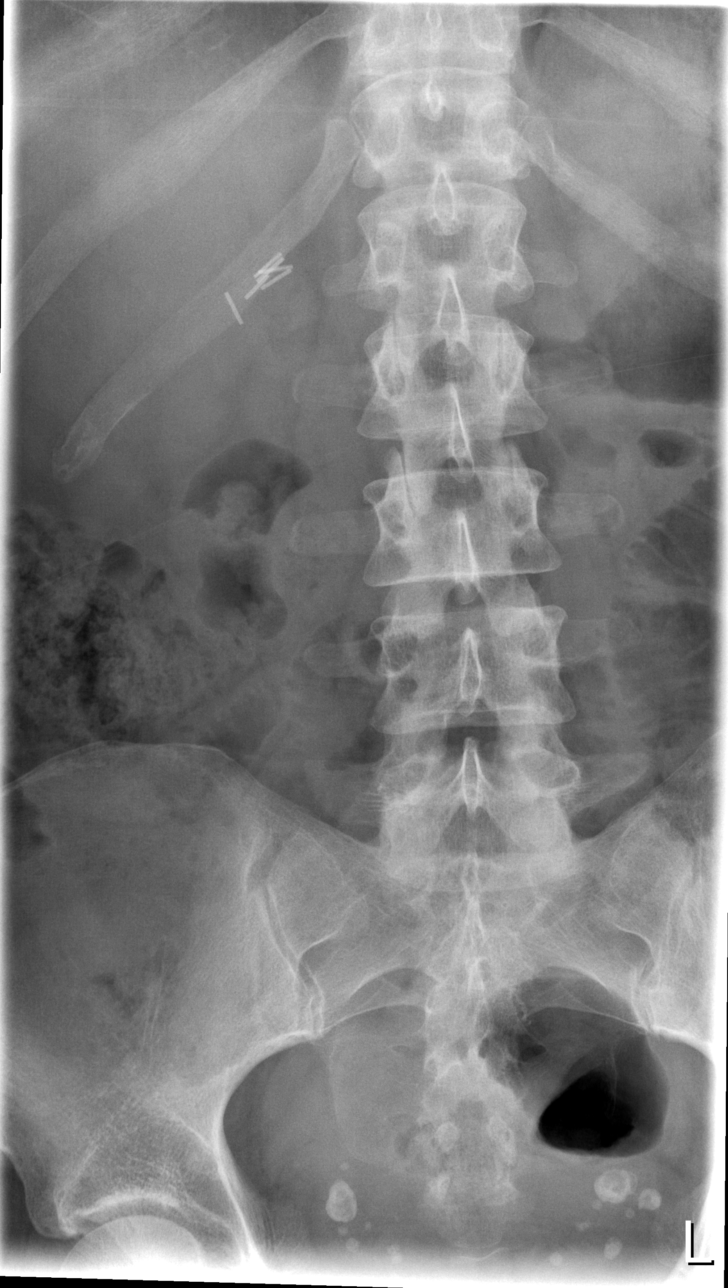

[t l-spine oblique exposure (1 of 2)]
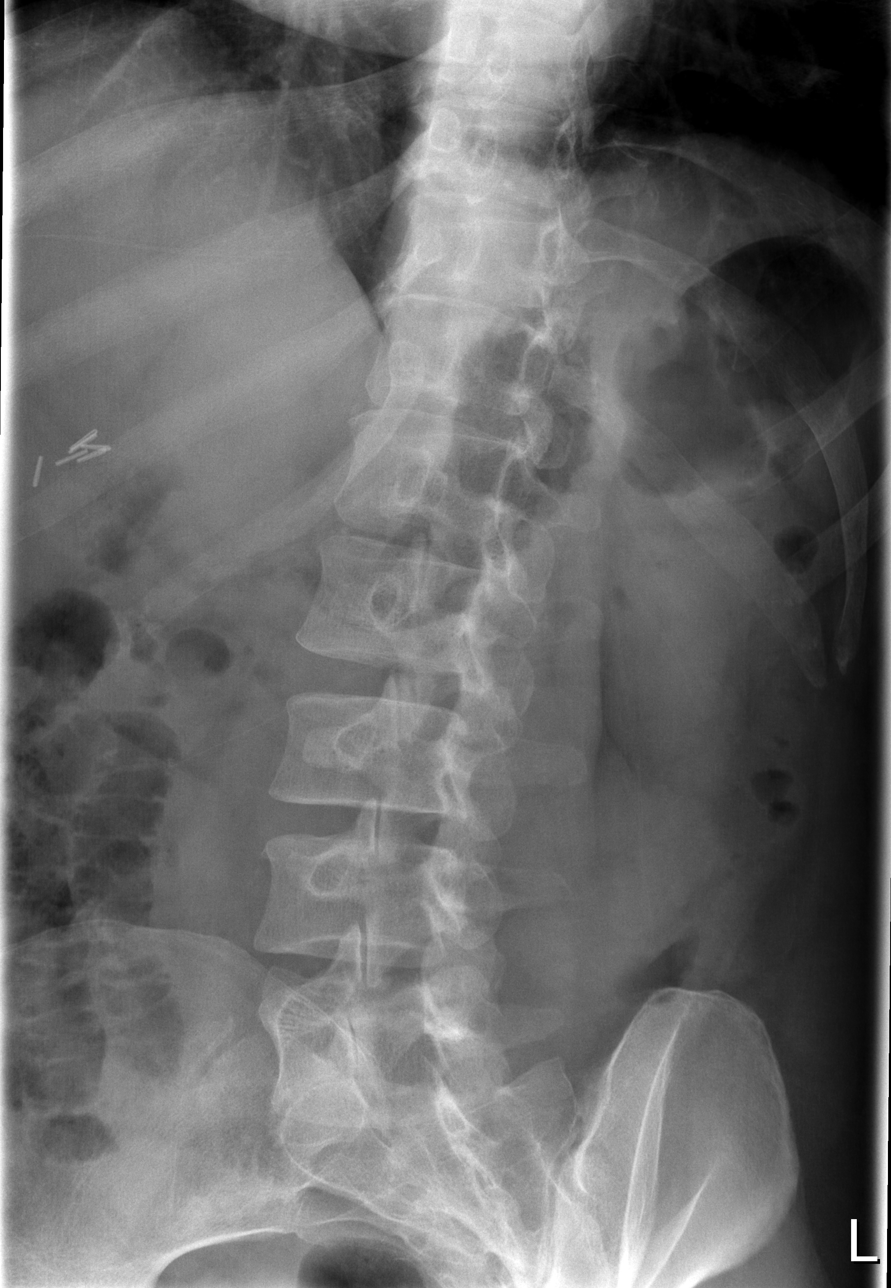

[t l-spine oblique exposure (2 of 2)]
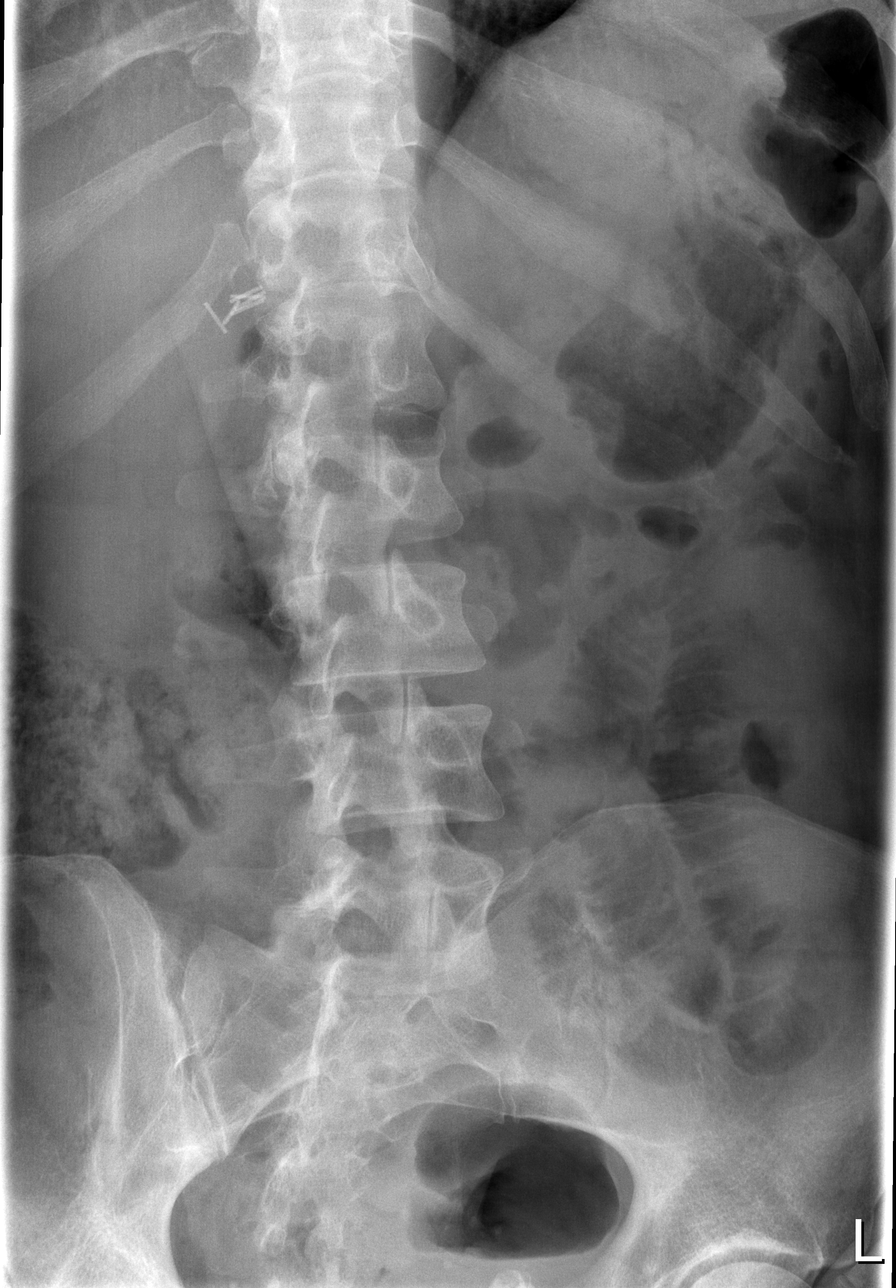

[t l-spine lat]
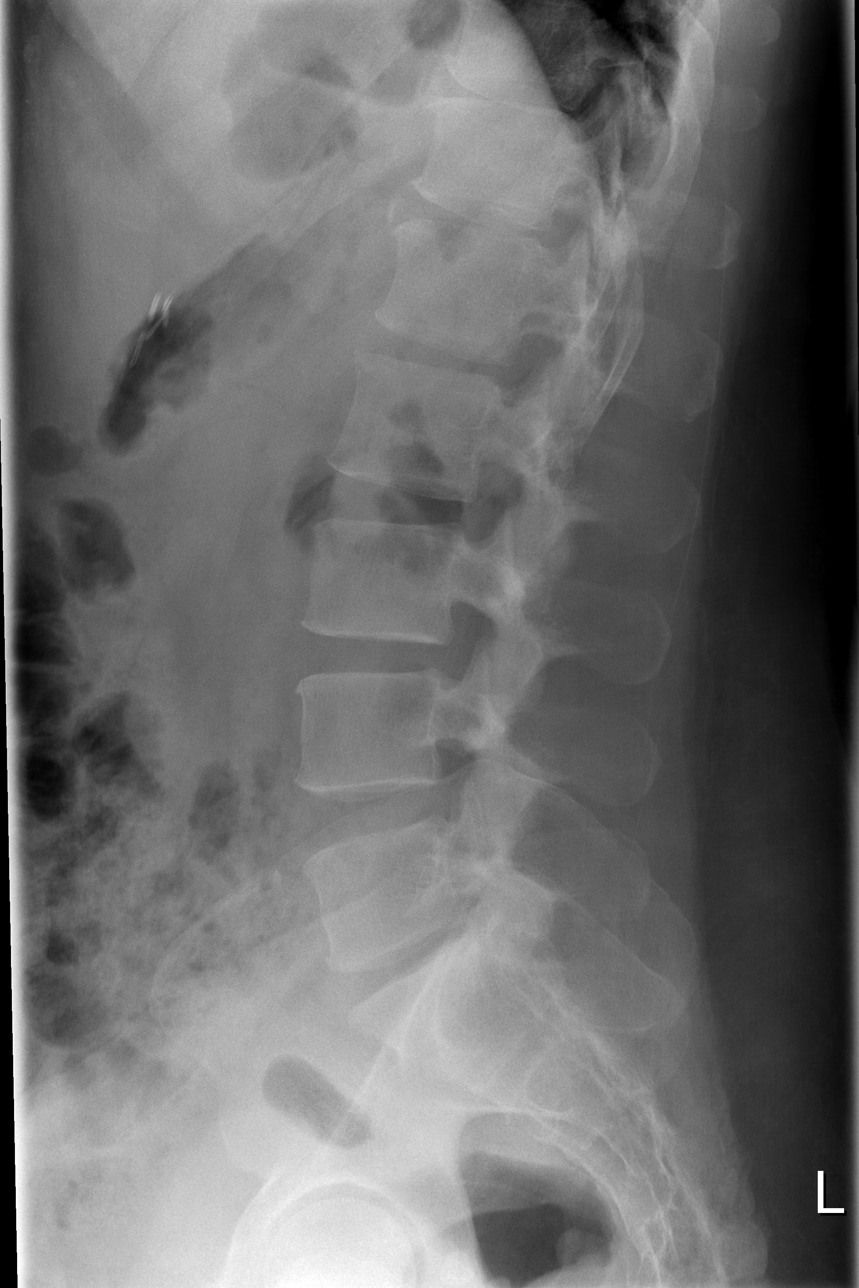

[t l-spine l5-s1 spot]
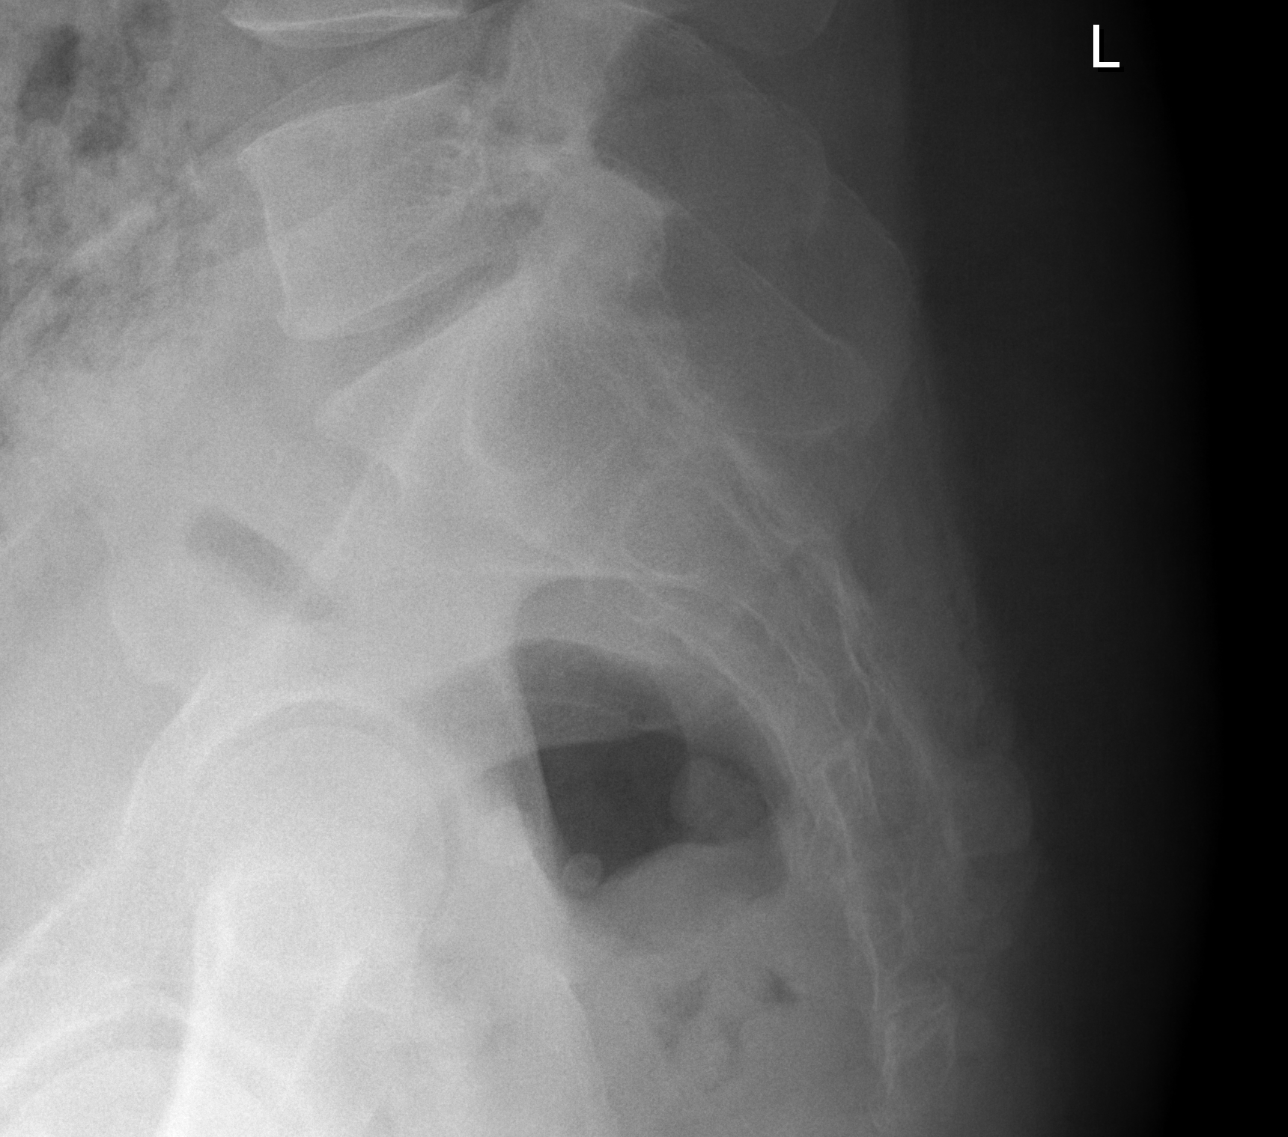

[5 of 5 positions shown; findings below may reference images not displayed]

FINDINGS: There is mild loss of disc height at L5-S1 where there is mild facet
degenerative change.

No fracture. No spondylolisthesis. There is minor anterior endplate
spurring in the visualized lower thoracic spine. No other
degenerative change. Soft tissues are unremarkable.
IMPRESSION: 1. No fracture or acute finding.
2. Mild degenerative changes most evident at L5-S1. Stable
appearance from the prior CT.

## 2014-09-15 IMAGING — CR DG THORACIC SPINE 2V
3 series · 3 of 3 positions shown · non-contrast
Comparison: None.

CLINICAL DATA: Back pain after moving furniture.

EXAM:
THORACIC SPINE - 2 VIEW

[w swimmers view]
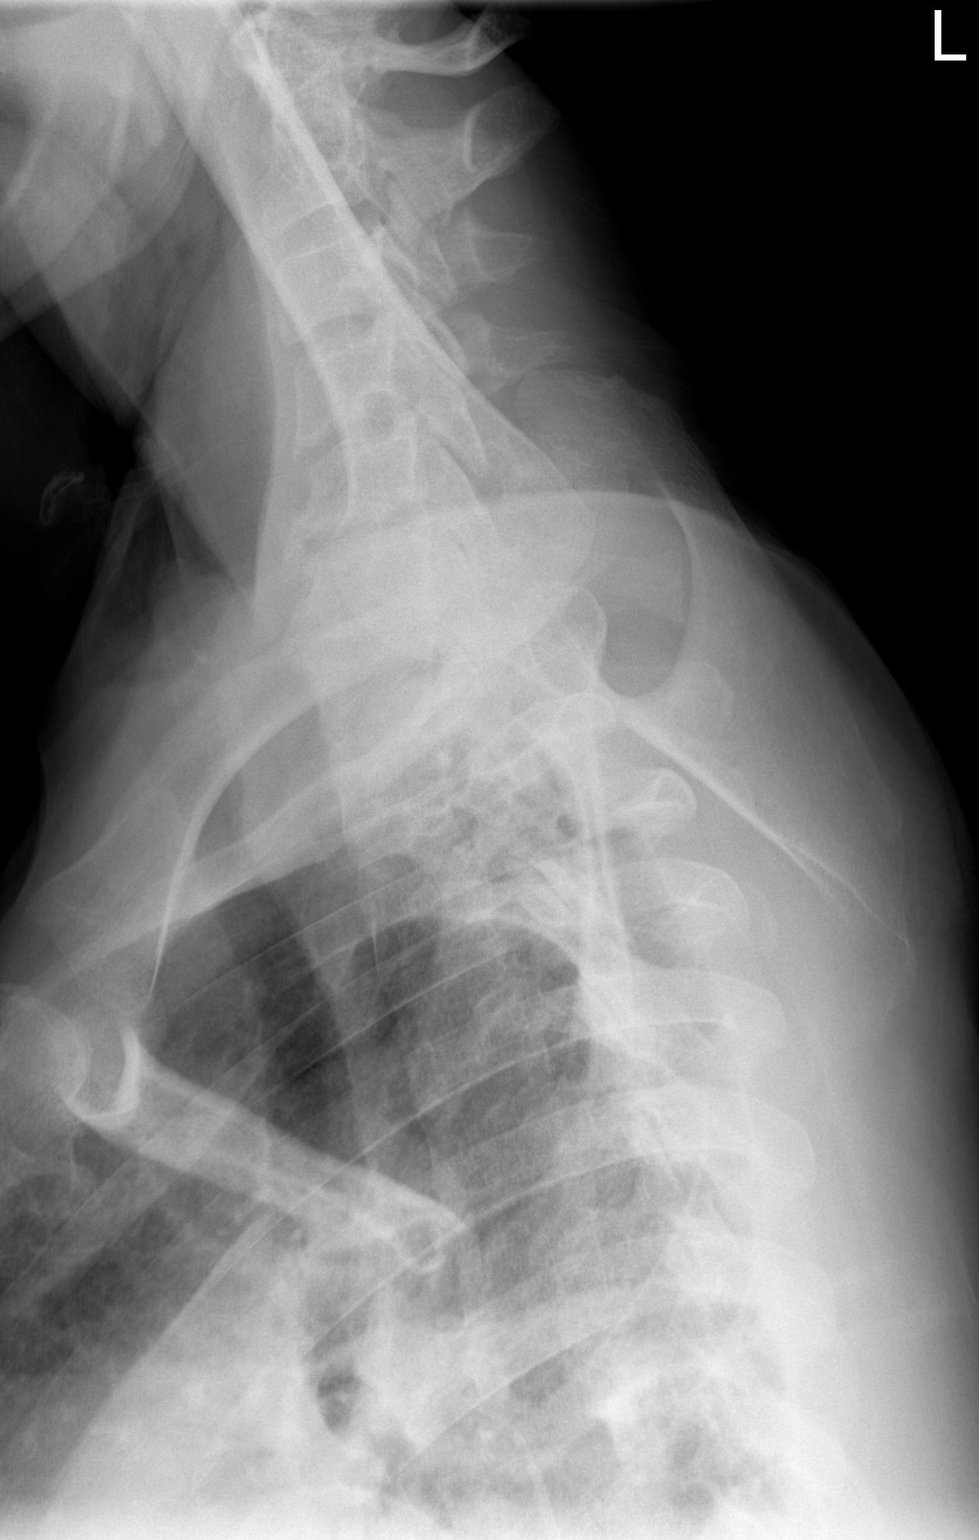

[t t-spine a.p.]
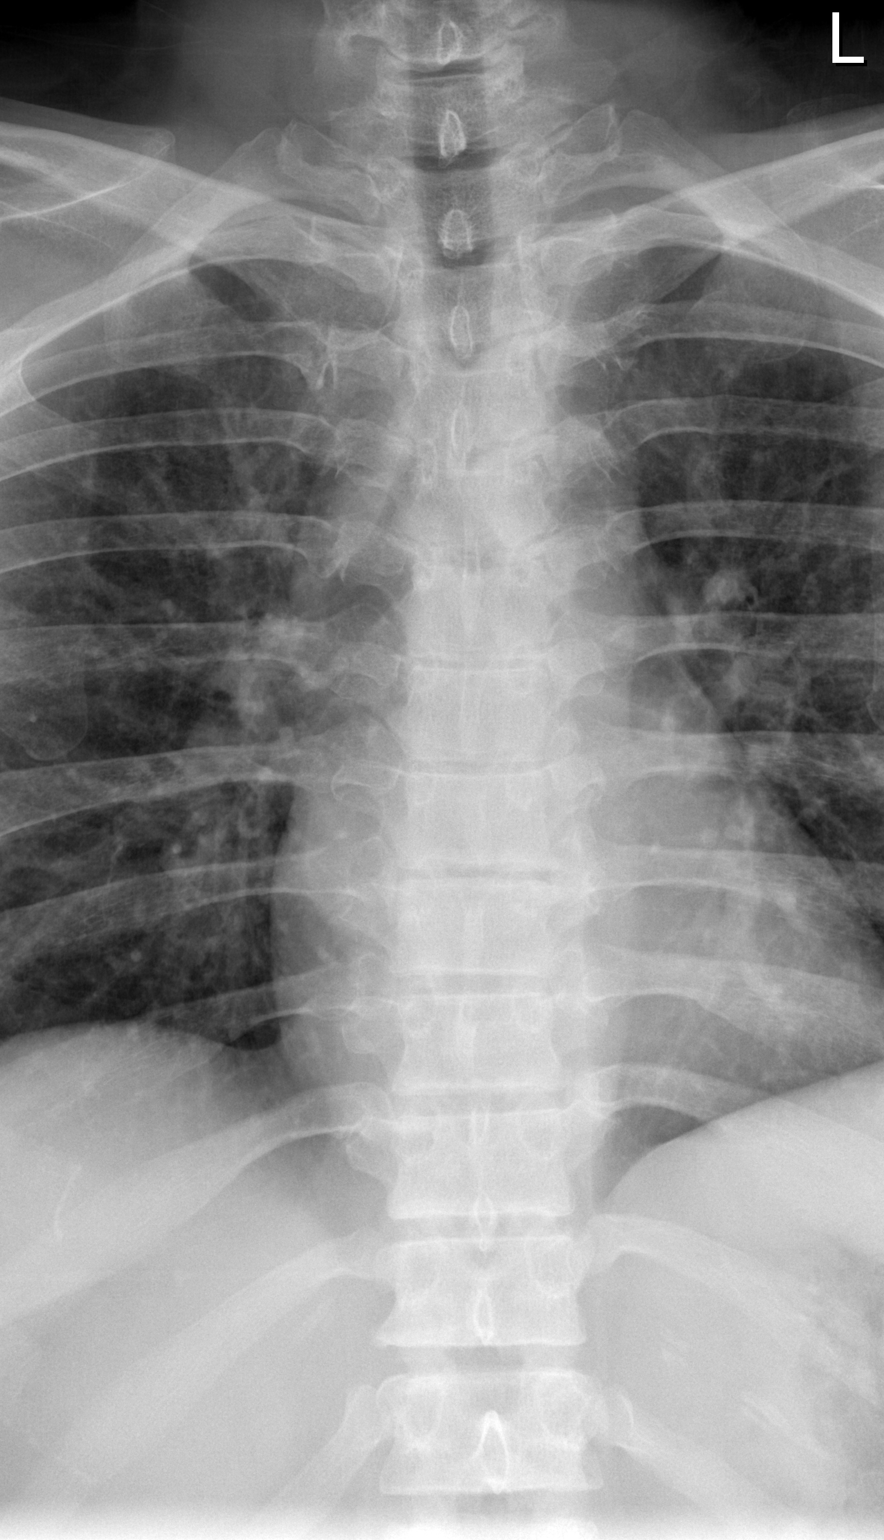

[t t-spine lat]
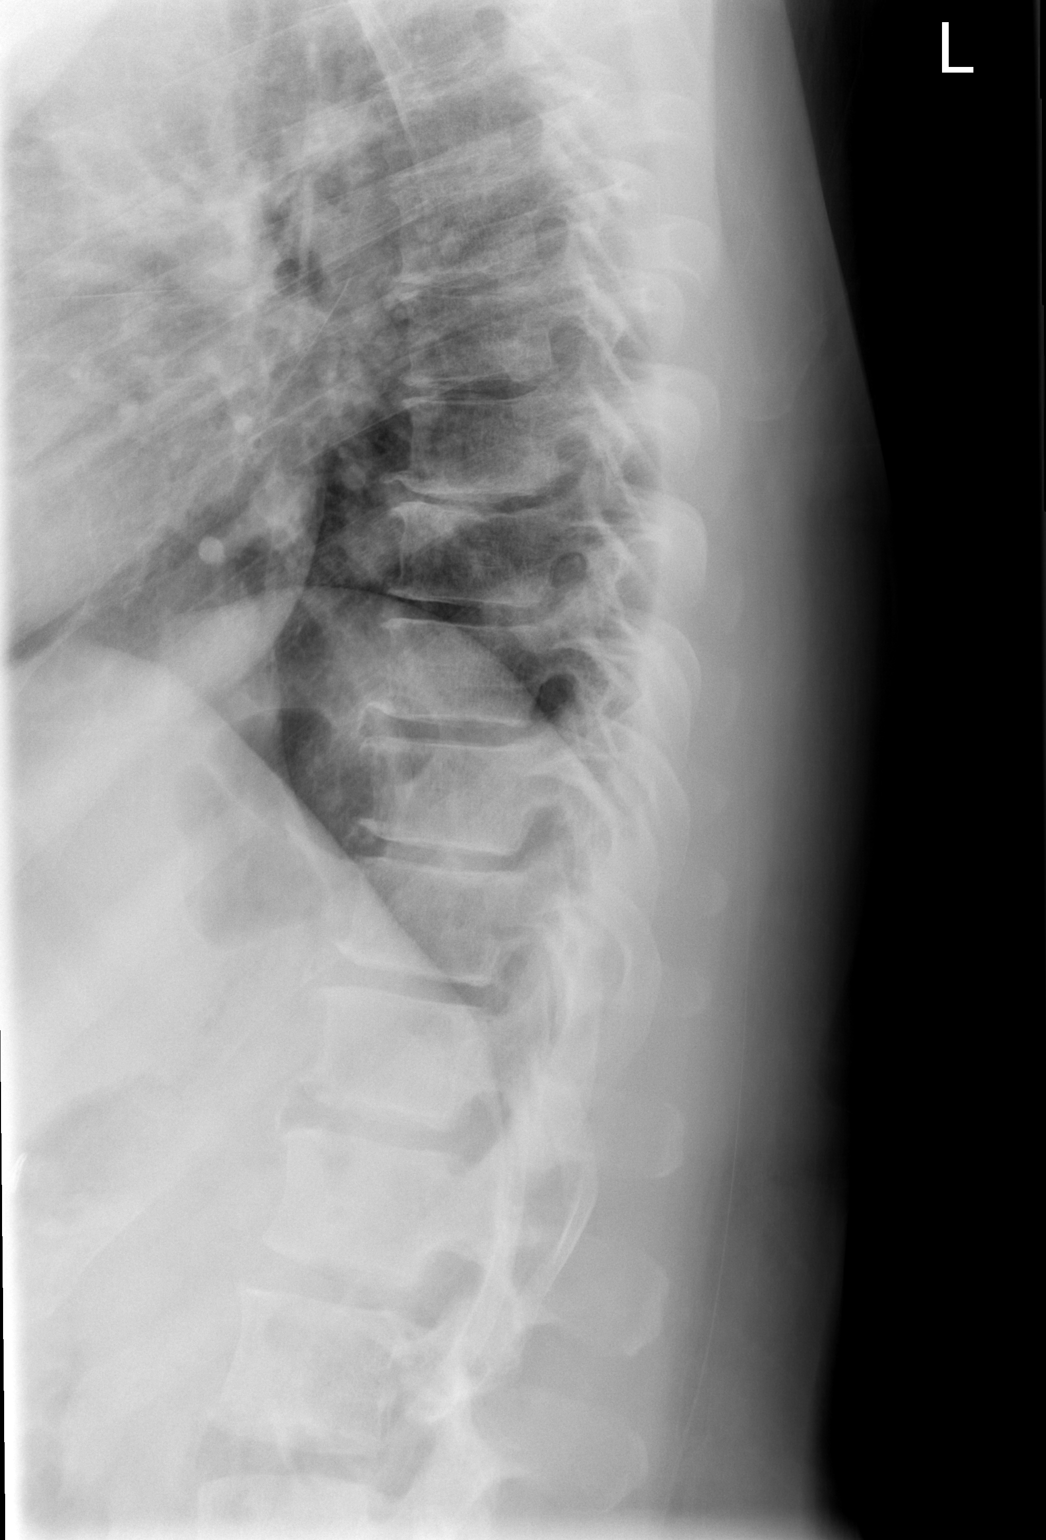

[3 of 3 positions shown; findings below may reference images not displayed]

FINDINGS: No fracture. No spondylolisthesis. There are mild degenerative
changes with small endplate osteophytes projecting along the mid and
lower thoracic spine. There is loss of disc height at T7-T8 with
mild endplate sclerosis. These findings are stable from a chest
radiograph dated 08/18/2013.

Soft tissues are unremarkable.
IMPRESSION: Mild degenerative changes.  No fracture or acute finding.
